# Patient Record
Sex: Female | Born: 1980 | Race: White | Hispanic: No | Marital: Married | Smoking: Former smoker
Health system: Southern US, Community
[De-identification: ages and names within clinical notes are randomized; demographics above are authoritative.]

## PROBLEM LIST (undated history)

## (undated) ENCOUNTER — Inpatient Hospital Stay: Payer: Self-pay

## (undated) DIAGNOSIS — F329 Major depressive disorder, single episode, unspecified: Secondary | ICD-10-CM

## (undated) DIAGNOSIS — Z87442 Personal history of urinary calculi: Secondary | ICD-10-CM

## (undated) DIAGNOSIS — F32A Depression, unspecified: Secondary | ICD-10-CM

## (undated) HISTORY — PX: SPINE SURGERY: SHX786

## (undated) HISTORY — PX: WISDOM TOOTH EXTRACTION: SHX21

---

## 2014-05-28 ENCOUNTER — Encounter: Payer: Self-pay | Admitting: Obstetrics and Gynecology

## 2014-10-04 ENCOUNTER — Inpatient Hospital Stay: Payer: Self-pay | Admitting: Obstetrics and Gynecology

## 2015-01-01 NOTE — H&P (Signed)
L&D Evaluation:  History Expanded:  HPI 34 yo G1 with EDD of 09/30/14 per LMP & 7 wk US, presents at 1765w4d with c/o regular, strong contraction since 1900 last night. Denies LOF. Somewhat decreased FM. + Bloody show. [facility], early entry to care, at least 3 fibroids with growth US 77% at 31 wks, bilateral CP cysts for which pt saw DP. Labs: O+/VI/RI/GBS negative.   Presents with contractions   Patient's Medical History No Chronic Illness   Patient's Surgical History none   Medications Pre Natal Vitamins   Allergies NKDA   Social History none   Exam:  Vital Signs initial BP 140/100   General appears uncomfortable   Mental Status clear   Chest no increased work of breathing   Abdomen gravid, tender with contractions   Estimated Fetal Weight 7.5-8 lbs   Pelvic 4/100/-1 (compared to 1 cm at office 10/02/14)   Mebranes Intact   FHT baseline 130, min variability, early decel seen   Ucx regular, q 1-5 min   Impression:  Impression active labor   Plan:  Plan monitor contractions and for cervical change   Comments Saline lock and labs for now Will give IV fluids if NST does not become reactive   Electronic Signatures: Vella KohlerBrothers, Nikiya Starn K (CNM)  (Signed 11-Feb-16 03:51)  Authored: L&D Evaluation   Last Updated: 11-Feb-16 03:51 by Vella KohlerBrothers, Koren Sermersheim K (CNM)

## 2016-08-24 NOTE — L&D Delivery Note (Signed)
Date of delivery: 03/23/2017 Estimated Date of Delivery: 04/11/17 Patient's last menstrual period was 07/05/2016. EGA: 664w2d  Delivery Note At 3:26 PM a viable female was delivered via Vaginal, Spontaneous Delivery (Presentation: OA;  LOA).  APGAR: 6, 8; weight 6 lb 7.4 oz (2930 g).   Placenta status: spontaneous, intact.  Cord:  with the following complications: none.  Cord pH: NA  Anesthesia:  IV Stadol in early labor, Nitrous Oxide at transition Episiotomy: None Lacerations: None Suture Repair: NA Est. Blood Loss (mL):  200  Informed that patient was 7 cm at 3:18 PM and she was requesting an epidural. Informed at 3:25 PM that I was needed in room and that patient was pushing. I arrived in the delivery room about 1 minute later and the head was out and baby being delivered by RN Vergia Albertsarmen Johnson. There was a nuchal and body cord reduced after delivery. Baby was put skin to skin on mom's chest. The cord was clamped x2 and cut by the dad at 3 minutes. The baby was taken to the warmer for further evaluation during transition. Cord blood obtained. Placenta delivered spontaneously and intact with a 3 vessel cord. Perineum intact and no other lacerations. Hemostasis obtained with fundal massage and IM Pitocin. Baby brought back to mom for skin to skin.   Mom to postpartum.  Baby to Couplet care / Skin to Skin.  Tresea MallJane Adelheid Hoggard, CNM 03/23/2017, 4:01 PM

## 2016-08-31 LAB — OB RESULTS CONSOLE HGB/HCT, BLOOD
HEMATOCRIT: 42 %
HEMOGLOBIN: 14 g/dL

## 2016-08-31 LAB — OB RESULTS CONSOLE RPR: RPR: NONREACTIVE

## 2016-08-31 LAB — OB RESULTS CONSOLE VARICELLA ZOSTER ANTIBODY, IGG: Varicella: IMMUNE

## 2016-08-31 LAB — OB RESULTS CONSOLE ANTIBODY SCREEN: Antibody Screen: NEGATIVE

## 2016-08-31 LAB — OB RESULTS CONSOLE ABO/RH: RH Type: POSITIVE

## 2016-08-31 LAB — OB RESULTS CONSOLE RUBELLA ANTIBODY, IGM: Rubella: IMMUNE

## 2016-08-31 LAB — OB RESULTS CONSOLE HIV ANTIBODY (ROUTINE TESTING): HIV: NONREACTIVE

## 2016-08-31 LAB — OB RESULTS CONSOLE PLATELET COUNT: PLATELETS: 291 10*3/uL

## 2016-08-31 LAB — OB RESULTS CONSOLE HEPATITIS B SURFACE ANTIGEN: Hepatitis B Surface Ag: NEGATIVE

## 2016-10-26 ENCOUNTER — Ambulatory Visit (INDEPENDENT_AMBULATORY_CARE_PROVIDER_SITE_OTHER): Payer: BLUE CROSS/BLUE SHIELD | Admitting: Certified Nurse Midwife

## 2016-10-26 ENCOUNTER — Encounter: Payer: Self-pay | Admitting: Certified Nurse Midwife

## 2016-10-26 VITALS — BP 92/52 | Ht 63.0 in | Wt 153.0 lb

## 2016-10-26 DIAGNOSIS — Z3482 Encounter for supervision of other normal pregnancy, second trimester: Secondary | ICD-10-CM

## 2016-10-26 DIAGNOSIS — Z139 Encounter for screening, unspecified: Secondary | ICD-10-CM

## 2016-10-26 DIAGNOSIS — Z3A16 16 weeks gestation of pregnancy: Secondary | ICD-10-CM

## 2016-10-26 DIAGNOSIS — O26852 Spotting complicating pregnancy, second trimester: Secondary | ICD-10-CM

## 2016-10-26 DIAGNOSIS — Z3490 Encounter for supervision of normal pregnancy, unspecified, unspecified trimester: Secondary | ICD-10-CM | POA: Insufficient documentation

## 2016-10-26 NOTE — Progress Notes (Signed)
Pt c/o spotting last week that happened twice. She tried to call and did not receive a call back.

## 2016-10-26 NOTE — Progress Notes (Signed)
Had two episodes of spotting in the last two weeks. No cramping. NO vulvar itching or irritation. Increased vaginal discharge.  Exam: FH at U-2FB FHTs WNL Ext: no lesions or inflammation Vagina: white discharge Cervix: mild eversion, no bleeding Wet prep negative for hyphae, Trich or clue cells A: Spotting in pregnancy, unknown etiology IUP at 16wk1d P: MSAFP today after discussion Anatomy scan and ROB in 3 weeks.

## 2016-10-26 NOTE — Addendum Note (Signed)
Addended by: Farrel ConnersGUTIERREZ, Nassim Cosma on: 10/26/2016 11:38 AM   Modules accepted: Orders

## 2016-10-28 NOTE — Addendum Note (Signed)
Addended by: Farrel ConnersGUTIERREZ, Davarious Tumbleson on: 10/28/2016 08:48 PM   Modules accepted: Orders

## 2016-11-01 LAB — AFP, SERUM, OPEN SPINA BIFIDA
AFP MOM: 0.72
AFP VALUE AFPOSL: 23.5 ng/mL
Gest. Age on Collection Date: 16.1 weeks
Maternal Age At EDD: 35.4 years
OSBR Risk 1 IN: 10000
Test Results:: NEGATIVE
Weight: 153 [lb_av]

## 2016-11-17 ENCOUNTER — Ambulatory Visit (INDEPENDENT_AMBULATORY_CARE_PROVIDER_SITE_OTHER): Payer: BLUE CROSS/BLUE SHIELD

## 2016-11-17 ENCOUNTER — Ambulatory Visit (INDEPENDENT_AMBULATORY_CARE_PROVIDER_SITE_OTHER): Payer: BLUE CROSS/BLUE SHIELD | Admitting: Obstetrics and Gynecology

## 2016-11-17 VITALS — BP 110/72 | Wt 152.0 lb

## 2016-11-17 DIAGNOSIS — Z3A19 19 weeks gestation of pregnancy: Secondary | ICD-10-CM

## 2016-11-17 DIAGNOSIS — Z369 Encounter for antenatal screening, unspecified: Secondary | ICD-10-CM

## 2016-11-17 DIAGNOSIS — Z3482 Encounter for supervision of other normal pregnancy, second trimester: Secondary | ICD-10-CM

## 2016-11-17 DIAGNOSIS — Z3492 Encounter for supervision of normal pregnancy, unspecified, second trimester: Secondary | ICD-10-CM

## 2016-11-17 DIAGNOSIS — O0993 Supervision of high risk pregnancy, unspecified, third trimester: Secondary | ICD-10-CM | POA: Insufficient documentation

## 2016-11-17 DIAGNOSIS — Z139 Encounter for screening, unspecified: Secondary | ICD-10-CM

## 2016-11-17 NOTE — Progress Notes (Signed)
Anatomy scan today incomplete for 4CH view. Will get completion anatomy next visit, pt does not want to know gender yet. No vb. No lof.

## 2016-12-03 ENCOUNTER — Other Ambulatory Visit: Payer: BLUE CROSS/BLUE SHIELD

## 2016-12-03 ENCOUNTER — Encounter: Payer: BLUE CROSS/BLUE SHIELD | Admitting: Advanced Practice Midwife

## 2016-12-08 ENCOUNTER — Ambulatory Visit (INDEPENDENT_AMBULATORY_CARE_PROVIDER_SITE_OTHER): Payer: BLUE CROSS/BLUE SHIELD | Admitting: Obstetrics and Gynecology

## 2016-12-08 ENCOUNTER — Encounter: Payer: Self-pay | Admitting: Obstetrics and Gynecology

## 2016-12-08 ENCOUNTER — Ambulatory Visit (INDEPENDENT_AMBULATORY_CARE_PROVIDER_SITE_OTHER): Payer: BLUE CROSS/BLUE SHIELD

## 2016-12-08 VITALS — BP 104/62 | Wt 165.0 lb

## 2016-12-08 DIAGNOSIS — Z3482 Encounter for supervision of other normal pregnancy, second trimester: Secondary | ICD-10-CM

## 2016-12-08 DIAGNOSIS — Z369 Encounter for antenatal screening, unspecified: Secondary | ICD-10-CM | POA: Diagnosis not present

## 2016-12-08 DIAGNOSIS — Z3492 Encounter for supervision of normal pregnancy, unspecified, second trimester: Secondary | ICD-10-CM

## 2016-12-08 DIAGNOSIS — Z3A22 22 weeks gestation of pregnancy: Secondary | ICD-10-CM | POA: Diagnosis not present

## 2016-12-08 NOTE — Patient Instructions (Signed)
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
## 2016-12-08 NOTE — Progress Notes (Signed)
Follow up anatomy scan today.  

## 2017-01-05 ENCOUNTER — Encounter: Payer: BLUE CROSS/BLUE SHIELD | Admitting: Obstetrics and Gynecology

## 2017-01-14 ENCOUNTER — Ambulatory Visit (INDEPENDENT_AMBULATORY_CARE_PROVIDER_SITE_OTHER): Payer: BLUE CROSS/BLUE SHIELD | Admitting: Advanced Practice Midwife

## 2017-01-14 VITALS — BP 112/62 | Wt 172.0 lb

## 2017-01-14 DIAGNOSIS — Z131 Encounter for screening for diabetes mellitus: Secondary | ICD-10-CM

## 2017-01-14 DIAGNOSIS — Z130 Encounter for screening for diseases of the blood and blood-forming organs and certain disorders involving the immune mechanism: Secondary | ICD-10-CM

## 2017-01-14 DIAGNOSIS — Z113 Encounter for screening for infections with a predominantly sexual mode of transmission: Secondary | ICD-10-CM

## 2017-01-14 DIAGNOSIS — Z3A27 27 weeks gestation of pregnancy: Secondary | ICD-10-CM
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
## 2017-01-29 ENCOUNTER — Ambulatory Visit (INDEPENDENT_AMBULATORY_CARE_PROVIDER_SITE_OTHER): Payer: BLUE CROSS/BLUE SHIELD | Admitting: Obstetrics and Gynecology

## 2017-01-29 ENCOUNTER — Telehealth: Payer: Self-pay | Admitting: Obstetrics and Gynecology

## 2017-01-29 ENCOUNTER — Other Ambulatory Visit: Payer: BLUE CROSS/BLUE SHIELD

## 2017-01-29 VITALS — BP 118/74 | Wt 172.0 lb

## 2017-01-29 DIAGNOSIS — Z113 Encounter for screening for infections with a predominantly sexual mode of transmission: Secondary | ICD-10-CM

## 2017-01-29 DIAGNOSIS — O99343 Other mental disorders complicating pregnancy, third trimester: Secondary | ICD-10-CM

## 2017-01-29 DIAGNOSIS — Z130 Encounter for screening for diseases of the blood and blood-forming organs and certain disorders involving the immune mechanism: Secondary | ICD-10-CM

## 2017-01-29 DIAGNOSIS — Z3493 Encounter for supervision of normal pregnancy, unspecified, third trimester: Secondary | ICD-10-CM

## 2017-01-29 DIAGNOSIS — Z3A29 29 weeks gestation of pregnancy: Secondary | ICD-10-CM

## 2017-01-29 DIAGNOSIS — F329 Major depressive disorder, single episode, unspecified: Secondary | ICD-10-CM

## 2017-01-29 DIAGNOSIS — Z131 Encounter for screening for diabetes mellitus: Secondary | ICD-10-CM

## 2017-01-29 MED ORDER — SERTRALINE HCL 50 MG PO TABS: 50.0000 mg | ORAL_TABLET | Freq: Every day | ORAL | 0 refills | Status: DC

## 2017-01-29 NOTE — Addendum Note (Signed)
Addended by: Thomasene MohairJACKSON, Jeury Mcnab D on: 01/29/2017 05:35 PM   Modules accepted: Orders

## 2017-01-29 NOTE — Telephone Encounter (Signed)
Dr. Jean RosenthalJackson,  This patient was seen this morning and said that you told her if she wanted you would send in a rx for her.  She has decided that she wants you to send it in today.  Pharm. [facility],  [address] [place].

## 2017-01-29 NOTE — Progress Notes (Addendum)
Pt c/o not being able to sleep, pelvic pain. No vb. No lof.  Anger/depressive sx today. EPDS 16.  She wants to monitor.  Zoloft offered. F/u 1 week. No SI/HI.  No e/o DV today.   ADDENDUM: patient  Called back stating she wants to start zoloft. Rx sent in.

## 2017-01-30 LAB — 28 WEEK RH+PANEL
BASOS ABS: 0 10*3/uL (ref 0.0–0.2)
Basos: 0 %
EOS (ABSOLUTE): 0.1 10*3/uL (ref 0.0–0.4)
EOS: 1 %
Gestational Diabetes Screen: 96 mg/dL (ref 65–139)
HEMATOCRIT: 37.6 % (ref 34.0–46.6)
HIV SCREEN 4TH GENERATION: NONREACTIVE
Hemoglobin: 12.7 g/dL (ref 11.1–15.9)
IMMATURE GRANULOCYTES: 0 %
Immature Grans (Abs): 0 10*3/uL (ref 0.0–0.1)
LYMPHS ABS: 1.1 10*3/uL (ref 0.7–3.1)
Lymphs: 17 %
MCH: 30.4 pg (ref 26.6–33.0)
MCHC: 33.8 g/dL (ref 31.5–35.7)
MCV: 90 fL (ref 79–97)
MONOCYTES: 6 %
Monocytes Absolute: 0.4 10*3/uL (ref 0.1–0.9)
NEUTROS PCT: 76 %
Neutrophils Absolute: 5.2 10*3/uL (ref 1.4–7.0)
PLATELETS: 206 10*3/uL (ref 150–379)
RBC: 4.18 x10E6/uL (ref 3.77–5.28)
RDW: 13.2 % (ref 12.3–15.4)
RPR: NONREACTIVE
WBC: 6.8 10*3/uL (ref 3.4–10.8)

## 2017-01-30 NOTE — Telephone Encounter (Signed)
Rx sent in

## 2017-02-01 NOTE — Telephone Encounter (Signed)
Pt aware . States she has not started taking medication yet, but wanted to have it in case she decided to start taking it. Will talk about it at [facility] if she has any questions/ concerns

## 2017-02-09 ENCOUNTER — Ambulatory Visit (INDEPENDENT_AMBULATORY_CARE_PROVIDER_SITE_OTHER): Payer: BLUE CROSS/BLUE SHIELD | Admitting: Obstetrics and Gynecology

## 2017-02-09 VITALS — BP 114/72 | Wt 175.0 lb

## 2017-02-09 DIAGNOSIS — F329 Major depressive disorder, single episode, unspecified: Secondary | ICD-10-CM

## 2017-02-09 DIAGNOSIS — O99343 Other mental disorders complicating pregnancy, third trimester: Secondary | ICD-10-CM

## 2017-02-09 DIAGNOSIS — Z3A31 31 weeks gestation of pregnancy: Secondary | ICD-10-CM

## 2017-02-09 DIAGNOSIS — O0993 Supervision of high risk pregnancy, unspecified, third trimester: Secondary | ICD-10-CM

## 2017-02-09 NOTE — Progress Notes (Signed)
No vb. No lof.  Has not started taking medication yet. Discussed more today. She states she is likely to start now. Continued to monitor. EPDS is 12 today, down from 16 prior.  No SI/HI.

## 2017-02-23 ENCOUNTER — Encounter: Payer: BLUE CROSS/BLUE SHIELD | Admitting: Advanced Practice Midwife

## 2017-03-08 ENCOUNTER — Ambulatory Visit (INDEPENDENT_AMBULATORY_CARE_PROVIDER_SITE_OTHER): Payer: BLUE CROSS/BLUE SHIELD | Admitting: Obstetrics and Gynecology

## 2017-03-08 VITALS — BP 106/70 | Wt 182.0 lb

## 2017-03-08 DIAGNOSIS — F329 Major depressive disorder, single episode, unspecified: Secondary | ICD-10-CM

## 2017-03-08 DIAGNOSIS — Z3490 Encounter for supervision of normal pregnancy, unspecified, unspecified trimester: Secondary | ICD-10-CM

## 2017-03-08 DIAGNOSIS — O99343 Other mental disorders complicating pregnancy, third trimester: Secondary | ICD-10-CM

## 2017-03-08 DIAGNOSIS — O0993 Supervision of high risk pregnancy, unspecified, third trimester: Secondary | ICD-10-CM

## 2017-03-08 DIAGNOSIS — Z3A35 35 weeks gestation of pregnancy: Secondary | ICD-10-CM

## 2017-03-08 NOTE — Progress Notes (Signed)
Prenatal Visit Note Date: 03/08/2017 Clinic: [facility]  Subjective:  Sandy Figueroa is a 36 y.o. G2P1001 at 212w1d being seen today for ongoing prenatal care.  She is currently monitored for the following issues for this low-risk pregnancy and has Second pregnancy; Supervision of high risk pregnancy, antepartum, third trimester; and Depression affecting pregnancy in third trimester, antepartum on her problem list.  Patient reports no complaints.   Contractions: Not present. Vag. Bleeding: None.  Movement: Present. Denies leaking of fluid.   The following portions of the patient's history were reviewed and updated as appropriate: allergies, current medications, past family history, past medical history, past social history, past surgical history and problem list. Problem list updated.  Objective:   Vitals:   03/08/17 1613  BP: 106/70  Weight: 182 lb (82.6 kg)    Fetal Status: Fetal Heart Rate (bpm): 140 Fundal Height: 34 cm Movement: Present     General:  Alert, oriented and cooperative. Patient is in no acute distress.  Skin: Skin is warm and dry. No rash noted.   Cardiovascular: Normal heart rate noted  Respiratory: Normal respiratory effort, no problems with respiration noted  Abdomen: Soft, gravid, appropriate for gestational age. Pain/Pressure: Absent     Pelvic:  Cervical exam deferred        Extremities: Normal range of motion.     Mental Status: Normal mood and affect. Normal behavior. Normal judgment and thought content.   Urinalysis:      Assessment and Plan:  Pregnancy: G2P1001 at 562w1d  1. Supervision of high risk pregnancy, antepartum, third trimester 2. Second pregnancy 3. Depression affecting pregnancy in third trimester, antepartum - not taking zoloft.  EPDS today 9.  4. [redacted] weeks gestation of pregnancy Preterm labor symptoms and general obstetric precautions including but not limited to vaginal bleeding, contractions, leaking of fluid and fetal movement  were reviewed in detail with the patient. Please refer to After Visit Summary for other counseling recommendations.  Return in about 1 week (around 03/15/2017) for Routine Prenatal Appointment.  Thomasene MohairStephen Jackson, MD 03/08/2017 4:49 PM

## 2017-03-17 ENCOUNTER — Ambulatory Visit (INDEPENDENT_AMBULATORY_CARE_PROVIDER_SITE_OTHER): Payer: BLUE CROSS/BLUE SHIELD | Admitting: Obstetrics and Gynecology

## 2017-03-17 VITALS — BP 118/70 | Wt 182.0 lb

## 2017-03-17 DIAGNOSIS — Z3A36 36 weeks gestation of pregnancy: Secondary | ICD-10-CM

## 2017-03-17 DIAGNOSIS — O365930 Maternal care for other known or suspected poor fetal growth, third trimester, not applicable or unspecified: Secondary | ICD-10-CM

## 2017-03-17 DIAGNOSIS — O0993 Supervision of high risk pregnancy, unspecified, third trimester: Secondary | ICD-10-CM

## 2017-03-17 DIAGNOSIS — O99343 Other mental disorders complicating pregnancy, third trimester: Secondary | ICD-10-CM

## 2017-03-17 DIAGNOSIS — F329 Major depressive disorder, single episode, unspecified: Secondary | ICD-10-CM

## 2017-03-17 DIAGNOSIS — F32A Depression, unspecified: Secondary | ICD-10-CM

## 2017-03-17 NOTE — Progress Notes (Signed)
Prenatal Visit Note Date: 03/17/2017 Clinic: [facility]  Subjective:  Sandy Figueroa is a 36 y.o. G2P1001 at 5353w3d being seen today for ongoing prenatal care.  She is currently monitored for the following issues for this high-risk pregnancy and has Second pregnancy; Supervision of high risk pregnancy, antepartum, third trimester; and Depression affecting pregnancy in third trimester, antepartum on her problem list.   Patient reports no bleeding and no leaking.   Contractions: Irregular. Vag. Bleeding: None.  Movement: Present. Denies leaking of fluid.   The following portions of the patient's history were reviewed and updated as appropriate: allergies, current medications, past family history, past medical history, past social history, past surgical history and problem list. Problem list updated.  Objective:   Vitals:   03/17/17 1416  BP: 118/70  Weight: 182 lb (82.6 kg)    Total weight gain so far: 32 lb (14.5 kg)   Fetal Status: Fetal Heart Rate (bpm): 140 Fundal Height: 33 cm Movement: Present     General:  Alert, oriented and cooperative. Patient is in no acute distress.  Skin: Skin is warm and dry. No rash noted.   Cardiovascular: Normal heart rate noted  Respiratory: Normal respiratory effort, no problems with respiration noted  Abdomen: Soft, gravid, appropriate for gestational age. Pain/Pressure: Absent     Pelvic:  Cervical exam deferred        Extremities: Normal range of motion.     Mental Status: Normal mood and affect. Normal behavior. Normal judgment and thought content.   Urinalysis: Urine Protein: Negative Urine Glucose: Negative  Assessment and Plan:  Pregnancy: G2P1001 at 553w3d  1. Supervision of high risk pregnancy, antepartum, third trimester - Strep Gp B NAA - GC/Chlamydia Probe Amp - US OB Follow Up; Future (measureing small for dates. Growth/AFI u/s)  2. Depression affecting pregnancy in third trimester, antepartum No issues today  3. [redacted] weeks  gestation of pregnancy - Strep Gp B NAA - GC/Chlamydia Probe Amp  4. Small-for-dates fetus, third trimester - US OB Follow Up; Future (growth u/s with AFI)  Term labor symptoms and general obstetric precautions including but not limited to vaginal bleeding, contractions, leaking of fluid and fetal movement were reviewed in detail with the patient. Please refer to After Visit Summary for other counseling recommendations.   Return in about 1 day (around 03/18/2017) for schedule u/s for growth/afi with Routine Prenatal Appointment after.   Thomasene MohairStephen Jackson, MD 03/17/2017 2:42 PM

## 2017-03-19 LAB — GC/CHLAMYDIA PROBE AMP
CHLAMYDIA, DNA PROBE: NEGATIVE
Neisseria gonorrhoeae by PCR: NEGATIVE

## 2017-03-19 LAB — STREP GP B NAA: STREP GROUP B AG: NEGATIVE

## 2017-03-22 ENCOUNTER — Inpatient Hospital Stay
Admission: EM | Admit: 2017-03-22 | Discharge: 2017-03-25 | DRG: 775 | Disposition: A | Discharge status: Home / Self Care | Payer: BLUE CROSS/BLUE SHIELD | Attending: Advanced Practice Midwife | Admitting: Advanced Practice Midwife

## 2017-03-22 ENCOUNTER — Ambulatory Visit (INDEPENDENT_AMBULATORY_CARE_PROVIDER_SITE_OTHER): Payer: BLUE CROSS/BLUE SHIELD

## 2017-03-22 ENCOUNTER — Observation Stay
Admission: EM | Admit: 2017-03-22 | Discharge: 2017-03-22 | Disposition: A | Discharge status: Home / Self Care | Payer: BLUE CROSS/BLUE SHIELD | Source: Home / Self Care | Admitting: Obstetrics and Gynecology

## 2017-03-22 ENCOUNTER — Ambulatory Visit (INDEPENDENT_AMBULATORY_CARE_PROVIDER_SITE_OTHER): Payer: BLUE CROSS/BLUE SHIELD | Admitting: Advanced Practice Midwife

## 2017-03-22 VITALS — BP 118/74 | Wt 184.0 lb

## 2017-03-22 DIAGNOSIS — R030 Elevated blood-pressure reading, without diagnosis of hypertension: Secondary | ICD-10-CM | POA: Diagnosis present

## 2017-03-22 DIAGNOSIS — Z9889 Other specified postprocedural states: Secondary | ICD-10-CM

## 2017-03-22 DIAGNOSIS — O0993 Supervision of high risk pregnancy, unspecified, third trimester: Secondary | ICD-10-CM

## 2017-03-22 DIAGNOSIS — F419 Anxiety disorder, unspecified: Secondary | ICD-10-CM

## 2017-03-22 DIAGNOSIS — O99343 Other mental disorders complicating pregnancy, third trimester: Secondary | ICD-10-CM

## 2017-03-22 DIAGNOSIS — Z3A37 37 weeks gestation of pregnancy: Secondary | ICD-10-CM

## 2017-03-22 DIAGNOSIS — O365930 Maternal care for other known or suspected poor fetal growth, third trimester, not applicable or unspecified: Secondary | ICD-10-CM | POA: Diagnosis not present

## 2017-03-22 DIAGNOSIS — O4103X0 Oligohydramnios, third trimester, not applicable or unspecified: Secondary | ICD-10-CM | POA: Diagnosis not present

## 2017-03-22 DIAGNOSIS — F329 Major depressive disorder, single episode, unspecified: Secondary | ICD-10-CM

## 2017-03-22 DIAGNOSIS — O4100X0 Oligohydramnios, unspecified trimester, not applicable or unspecified: Secondary | ICD-10-CM | POA: Diagnosis present

## 2017-03-22 DIAGNOSIS — Z87891 Personal history of nicotine dependence: Secondary | ICD-10-CM | POA: Diagnosis not present

## 2017-03-22 DIAGNOSIS — Z79899 Other long term (current) drug therapy: Secondary | ICD-10-CM

## 2017-03-22 DIAGNOSIS — O26893 Other specified pregnancy related conditions, third trimester: Secondary | ICD-10-CM | POA: Diagnosis present

## 2017-03-22 DIAGNOSIS — O99344 Other mental disorders complicating childbirth: Secondary | ICD-10-CM | POA: Diagnosis present

## 2017-03-22 HISTORY — DX: Major depressive disorder, single episode, unspecified: F32.9

## 2017-03-22 HISTORY — DX: Depression, unspecified: F32.A

## 2017-03-22 LAB — PROTEIN / CREATININE RATIO, URINE
Creatinine, Urine: 150 mg/dL
PROTEIN CREATININE RATIO: 0.13 mg/mg{creat} (ref 0.00–0.15)
Total Protein, Urine: 19 mg/dL

## 2017-03-22 LAB — CBC
HCT: 35.4 % (ref 35.0–47.0)
Hemoglobin: 12.3 g/dL (ref 12.0–16.0)
MCH: 30.1 pg (ref 26.0–34.0)
MCHC: 34.7 g/dL (ref 32.0–36.0)
MCV: 86.8 fL (ref 80.0–100.0)
PLATELETS: 193 10*3/uL (ref 150–440)
RBC: 4.08 MIL/uL (ref 3.80–5.20)
RDW: 12.6 % (ref 11.5–14.5)
WBC: 10.4 10*3/uL (ref 3.6–11.0)

## 2017-03-22 LAB — COMPREHENSIVE METABOLIC PANEL
ALK PHOS: 132 U/L — AB (ref 38–126)
ALT: 12 U/L — AB (ref 14–54)
AST: 24 U/L (ref 15–41)
Albumin: 3 g/dL — ABNORMAL LOW (ref 3.5–5.0)
Anion gap: 7 (ref 5–15)
BILIRUBIN TOTAL: 0.4 mg/dL (ref 0.3–1.2)
BUN: 15 mg/dL (ref 6–20)
CALCIUM: 9.2 mg/dL (ref 8.9–10.3)
CHLORIDE: 107 mmol/L (ref 101–111)
CO2: 22 mmol/L (ref 22–32)
CREATININE: 0.79 mg/dL (ref 0.44–1.00)
Glucose, Bld: 116 mg/dL — ABNORMAL HIGH (ref 65–99)
Potassium: 3.4 mmol/L — ABNORMAL LOW (ref 3.5–5.1)
Sodium: 136 mmol/L (ref 135–145)
TOTAL PROTEIN: 6.7 g/dL (ref 6.5–8.1)

## 2017-03-22 LAB — TYPE AND SCREEN
ABO/RH(D): O POS
Antibody Screen: NEGATIVE

## 2017-03-22 MED ORDER — OXYTOCIN 40 UNITS IN LACTATED RINGERS INFUSION - SIMPLE MED
2.5000 [IU]/h | INTRAVENOUS | Status: DC
  Filled 2017-03-22: qty 1000

## 2017-03-22 MED ORDER — SOD CITRATE-CITRIC ACID 500-334 MG/5ML PO SOLN: 30.0000 mL | ORAL | Status: DC | PRN

## 2017-03-22 MED ORDER — ONDANSETRON HCL 4 MG/2ML IJ SOLN: 4.0000 mg | Freq: Four times a day (QID) | INTRAMUSCULAR | Status: DC | PRN

## 2017-03-22 MED ORDER — LACTATED RINGERS IV SOLN
INTRAVENOUS | Status: DC
  Administered 2017-03-23 (×2): via INTRAVENOUS

## 2017-03-22 MED ORDER — SODIUM CHLORIDE 0.9 % IJ SOLN
INTRAMUSCULAR | Status: AC
  Filled 2017-03-22: qty 50

## 2017-03-22 MED ORDER — OXYTOCIN BOLUS FROM INFUSION: 500.0000 mL | Freq: Once | INTRAVENOUS | Status: DC

## 2017-03-22 MED ORDER — LIDOCAINE HCL (PF) 1 % IJ SOLN: 30.0000 mL | INTRAMUSCULAR | Status: DC | PRN

## 2017-03-22 MED ORDER — LACTATED RINGERS IV SOLN: 500.0000 mL | INTRAVENOUS | Status: DC | PRN

## 2017-03-22 MED ORDER — OXYTOCIN 10 UNIT/ML IJ SOLN
10.0000 [IU] | Freq: Once | INTRAMUSCULAR | Status: DC
  Administered 2017-03-23: 10 [IU] via INTRAMUSCULAR

## 2017-03-22 MED ORDER — TERBUTALINE SULFATE 1 MG/ML IJ SOLN: 0.2500 mg | Freq: Once | INTRAMUSCULAR | Status: DC | PRN

## 2017-03-22 MED ORDER — MISOPROSTOL 25 MCG QUARTER TABLET
25.0000 ug | ORAL_TABLET | ORAL | Status: DC | PRN
  Administered 2017-03-23 (×2): 25 ug via VAGINAL
  Filled 2017-03-22 (×2): qty 1

## 2017-03-22 NOTE — Progress Notes (Signed)
AFI today is 3.54. A 2x2 pocket is noted at a different time during the exam. Growth is 18%, 5# 13oz. Discussion with Dr Jean RosenthalJackson regarding POC. Patient to go to L&D for NST and further discussion with Dr Jean RosenthalJackson. Patient denies LOF, VB. Small amount of blood on glove following cervical check. External Os is 1.5 cm and Internal is 1 cm.

## 2017-03-22 NOTE — H&P (Signed)
OB History & Physical   History of Present Illness:  Chief Complaint: presents for induction of labor for oligohydramnios  HPI:  Sandy Figueroa is a 36 y.o. G2P1001 female at 5061w1d dated by LMP consistent with 10 week ultrasound.  Her pregnancy has been complicated by depression for which she has had to take no medication.    She denies contractions.   She denies leakage of fluid.   She denies vaginal bleeding.   She reports fetal movement.   She denies headache, visual changes, and RUQ pain.    Maternal Medical History:   Past Medical History:  Diagnosis Date  . Depression     Past Surgical History:  Procedure Laterality Date  . SPINE SURGERY    . WISDOM TOOTH EXTRACTION     Allergies: No Known Allergies  Prior to Admission medications   Denies    OB History  Gravida Para Term Preterm AB Living  2 1 1     1   SAB TAB Ectopic Multiple Live Births          1    # Outcome Date GA Lbr Len/2nd Weight Sex Delivery Anes PTL Lv  2 Current           1 Term 10/04/14   8 lb 12 oz (3.969 kg) M Vag-Spont  N LIV      Prenatal care site: [facility]  Social History: She  reports that she quit smoking about 10 years ago. Her smoking use included Cigarettes. She has never used smokeless tobacco. She reports that she does not drink alcohol or use drugs.  Family History: family history includes Heart disease in her father; Hyperlipidemia in her father; Hypertension in her father; Kidney disease in her father.   Review of Systems:  Review of Systems  Constitutional: Negative.   HENT: Negative.   Eyes: Negative.   Respiratory: Negative.   Cardiovascular: Negative.   Gastrointestinal: Negative.   Genitourinary: Negative.   Musculoskeletal: Negative.   Skin: Negative.   Neurological: Negative.   Psychiatric/Behavioral: Negative.      Physical Exam:  Vital Signs: BP (!) 134/96 (BP Location: Left Arm)   Pulse 99   Temp 98.5 F (36.9 C) (Oral)   Resp 19   Ht 5\' 3"  (1.6  m)   Wt 184 lb (83.5 kg)   LMP 07/05/2016   BMI 32.59 kg/m  Physical Exam  Constitutional: She is oriented to person, place, and time and well-developed, well-nourished, and in no distress. No distress.  HENT:  Head: Normocephalic and atraumatic.  Eyes: Conjunctivae are normal. No scleral icterus.  Neck: Normal range of motion. Neck supple. No thyromegaly present.  Cardiovascular: Normal rate, regular rhythm and normal heart sounds.  Exam reveals no gallop and no friction rub.   No murmur heard. Pulmonary/Chest: Effort normal and breath sounds normal. No respiratory distress. She has no wheezes. She has no rales. She exhibits no tenderness.  Abdominal: Soft. Bowel sounds are normal. She exhibits no distension. There is no tenderness. There is no rebound and no guarding.  Gravid, NT  Genitourinary:  Genitourinary Comments: NEFG, cvx: 1/50/-3/medium consistency/posterior  Musculoskeletal: Normal range of motion. She exhibits no edema.  Lymphadenopathy:    She has no cervical adenopathy.  Neurological: She is alert and oriented to person, place, and time. No cranial nerve deficit.  Skin: Skin is warm and dry. No erythema.  Psychiatric: Mood, affect and judgment normal.   Foley bulb placed through cervix without difficulty.  Female chaperone present for pelvic exam and foley bulb placement  Pertinent Results:  Prenatal Labs: Blood type/Rh O positive  Antibody screen negative  Rubella Immune  Varicella Immune    RPR NR  HBsAg negative  HIV negative  GC negative  Chlamydia negative  Genetic screening First trim negative, msAFP neg  1 hour GTT 96  3 hour GTT n/a  GBS negative on 03/17/17   Baseline FHR: 150 beats/min   Variability: moderate   Accelerations: present   Decelerations: absent Contractions: present frequency: rare Overall assessment: category 1   Assessment:  Sandy Figueroa is a 36 y.o. 862P1001 female at 8856w1d with oligohydramnios, elevated blood pressures.    Plan:  1. Admit to Labor & Delivery  2. CBC, T&S, Clrs, IVF 3. GBS negative.   4. Fetwal well-being: reassuring 5. Oligohydramnios: IOL for AFI < 5.0 and no MVP of 2 cm.  Discussed the risks/benefits of continued monitoring of pregnancy versus delivery.  Given the findings today, patient agrees to move forward with IOL.   6. Elevated blood pressures.  Will continue to monitor.  Labs sent as she has had several elevated ones now that might be consistent with gestational hypertension. Will send labs for preeclampsia, including urine protein/creatinine ratio.   Thomasene MohairStephen Tammy Wickliffe, MD 03/22/2017 10:27 PM

## 2017-03-22 NOTE — Discharge Summary (Signed)
See Final progress note 

## 2017-03-22 NOTE — Final Progress Note (Signed)
Physician Final Progress Note  Patient ID: Sandy Figueroa MRN: [id number] DOB/AGE: 35/04/1981 34 y.o.  Admit date: 03/22/2017 Admitting provider: Conard NovakStephen D Anay Walter, MD Discharge date: 03/22/2017  Admission Diagnoses:  1) intrauterine pregnancy at 7617w1d 2) oligohydramnios  Discharge Diagnoses:  1) intrauterine pregnancy at 4617w1d 2) oligohydramnios  History of Present Illness: The patient is a 36 y.o. female G2P1001 at 4917w1d who presents for further evaluation of oligohydramnios.  Her pregnancy has been complicated by depression/anxiety.  She presented for a clinic appointment today due to an ultrasound showing an AFI of 3.5 cm with no pocket measuring > 2.0 cm.  Growth was 17.5th %ile.  FL < 2.0%ile.  She notes +FM, no LOF, no VB.  Denies ctx.    Hospital Course: patient monitored for NST, which was reactive. Her initial BP was borderline. Otherwise her BPs have been normal.  It was strongly recommended to her that we induce for oligohydramnios.  She accepts, but would like to go home to get organized for a couple of hours.  She will present to L&D at 8pm for induction of labor.  Precautions given to return earlier than 8pm (2 hours from now).  Past Medical History: depression  Past Surgical History:  Procedure Laterality Date  . SPINE SURGERY    . WISDOM TOOTH EXTRACTION      No current facility-administered medications on file prior to encounter.    Current Outpatient Prescriptions on File Prior to Encounter  Medication Sig Dispense Refill  . sertraline (ZOLOFT) 50 MG tablet Take 1 tablet (50 mg total) by mouth at bedtime. 30 tablet 0   Allergies: No Known Allergies  Social History   Social History  . Marital status: Married    Spouse name: N/A  . Number of children: N/A  . Years of education: N/A   Occupational History  . Not on file.   Social History Main Topics  . Smoking status: Former Smoker    Types: Cigarettes    Quit date: 08/24/2006  . Smokeless tobacco:  Never Used  . Alcohol use No  . Drug use: No  . Sexual activity: Yes   Other Topics Concern  . Not on file   Social History Narrative  . No narrative on file    Physical Exam: BP 125/84   Pulse 76   Temp 98.1 F (36.7 C) (Oral)   Resp 16   LMP 07/05/2016   Gen: NAD CV: RRR Pulm: CTAB Pelvic: deferred (1cm in clinic today) Ext: no e/c/t  Consults: None  Significant Findings/ Diagnostic Studies: none (see clinic notes from today)  Procedures: NST Baseline FHR: 140 beats/min Variability: moderate Accelerations: present Decelerations: absent Tocometry: irregular, infrequen  Interpretation:  INDICATIONS: oligohydramnios RESULTS:  A NST procedure was performed with FHR monitoring and a normal baseline established, appropriate time of 20-40 minutes of evaluation, and accels >2 seen w 15x15 characteristics.  Results show a REACTIVE NST.     Discharge Condition: stable  Disposition:   Diet: Regular diet  Discharge Activity: Activity as tolerated   Allergies as of 03/22/2017   No Known Allergies     Medication List    TAKE these medications   sertraline 50 MG tablet Commonly known as:  ZOLOFT Take 1 tablet (50 mg total) by mouth at bedtime.        Total time spent taking care of this patient: 30 minutes. Patient to return in 2 hours for IOL.   Signed: Thomasene MohairStephen Moe Graca, MD  03/22/2017, 5:52 PM

## 2017-03-22 NOTE — Progress Notes (Signed)
Growth/afi today. No vb. No lof

## 2017-03-23 DIAGNOSIS — O4103X0 Oligohydramnios, third trimester, not applicable or unspecified: Secondary | ICD-10-CM

## 2017-03-23 DIAGNOSIS — Z3A37 37 weeks gestation of pregnancy: Secondary | ICD-10-CM

## 2017-03-23 MED ORDER — BUTORPHANOL TARTRATE 2 MG/ML IJ SOLN
INTRAMUSCULAR | Status: AC
  Filled 2017-03-23: qty 1

## 2017-03-23 MED ORDER — SENNOSIDES-DOCUSATE SODIUM 8.6-50 MG PO TABS
2.0000 | ORAL_TABLET | ORAL | Status: DC
  Administered 2017-03-24 (×2): 2 via ORAL
  Filled 2017-03-23 (×2): qty 2

## 2017-03-23 MED ORDER — ONDANSETRON HCL 4 MG PO TABS: 4.0000 mg | ORAL_TABLET | ORAL | Status: DC | PRN

## 2017-03-23 MED ORDER — DIBUCAINE 1 % RE OINT: 1.0000 "application " | TOPICAL_OINTMENT | RECTAL | Status: DC | PRN

## 2017-03-23 MED ORDER — SIMETHICONE 80 MG PO CHEW: 80.0000 mg | CHEWABLE_TABLET | ORAL | Status: DC | PRN

## 2017-03-23 MED ORDER — COCONUT OIL OIL
1.0000 "application " | TOPICAL_OIL | Status: DC | PRN
  Administered 2017-03-23: 1 via TOPICAL
  Filled 2017-03-23: qty 120

## 2017-03-23 MED ORDER — SODIUM CHLORIDE FLUSH 0.9 % IV SOLN
INTRAVENOUS | Status: AC
  Filled 2017-03-23: qty 10

## 2017-03-23 MED ORDER — ONDANSETRON HCL 4 MG/2ML IJ SOLN: 4.0000 mg | INTRAMUSCULAR | Status: DC | PRN

## 2017-03-23 MED ORDER — PRENATAL MULTIVITAMIN CH
1.0000 | ORAL_TABLET | Freq: Every day | ORAL | Status: DC
  Administered 2017-03-25: 1 via ORAL
  Filled 2017-03-23 (×2): qty 1

## 2017-03-23 MED ORDER — IBUPROFEN 600 MG PO TABS
600.0000 mg | ORAL_TABLET | Freq: Four times a day (QID) | ORAL | Status: DC
  Administered 2017-03-23 – 2017-03-25 (×9): 600 mg via ORAL
  Filled 2017-03-23 (×9): qty 1

## 2017-03-23 MED ORDER — BENZOCAINE-MENTHOL 20-0.5 % EX AERO
1.0000 "application " | INHALATION_SPRAY | CUTANEOUS | Status: DC | PRN
  Administered 2017-03-23: 1 via TOPICAL
  Filled 2017-03-23: qty 56

## 2017-03-23 MED ORDER — BUTORPHANOL TARTRATE 1 MG/ML IJ SOLN
1.0000 mg | INTRAMUSCULAR | Status: DC | PRN
  Administered 2017-03-23 (×3): 1 mg via INTRAVENOUS
  Filled 2017-03-23: qty 1

## 2017-03-23 MED ORDER — DIPHENHYDRAMINE HCL 25 MG PO CAPS: 25.0000 mg | ORAL_CAPSULE | Freq: Four times a day (QID) | ORAL | Status: DC | PRN

## 2017-03-23 MED ORDER — TETANUS-DIPHTH-ACELL PERTUSSIS 5-2.5-18.5 LF-MCG/0.5 IM SUSP: 0.5000 mL | Freq: Once | INTRAMUSCULAR | Status: DC

## 2017-03-23 MED ORDER — ACETAMINOPHEN 325 MG PO TABS: 650.0000 mg | ORAL_TABLET | ORAL | Status: DC | PRN

## 2017-03-23 MED ORDER — WITCH HAZEL-GLYCERIN EX PADS: 1.0000 "application " | MEDICATED_PAD | CUTANEOUS | Status: DC | PRN

## 2017-03-23 NOTE — Progress Notes (Addendum)
Pt removed tape from foley last night (per Bobbye RiggsAlexis Evans RN and per pt). Educated pt on need for traction with foley bulb to apply pressure on cervix. Patient is contracting Q1-2 and requests to wait until 0900 check to apply tape or find alternative traction.

## 2017-03-23 NOTE — Discharge Summary (Signed)
OB Discharge Summary     Patient Name: Sandy Figueroa DOB: 05/29/1981 MRN: [id number]  Date of admission: 03/22/2017 Delivering provider: Tresea MallJane Gledhill, CNM called to room as baby was being delivered by RN Vergia Albertsarmen Johnson Date of Delivery: 03/23/2017  Date of discharge: 03/25/2017  Admitting diagnosis: Term pregnancy with oligohydraminos Intrauterine pregnancy: 4774w2d     Secondary diagnosis: None     Discharge diagnosis: Term Pregnancy Delivered   oligohydramnios                                                                                             Post partum procedures:none  Augmentation: Cytotec and Foley Balloon  Complications: None  Hospital course:  Induction of Labor With Vaginal Delivery   36 y.o. yo G2P2002 at 174w2d was admitted to the hospital 03/22/2017 for induction of labor.   Indication for induction: oligohydramnios.   Patient had an uncomplicated labor course as follows: Membrane Rupture Time/Date: 2:45 PM ,03/23/2017   Patient had delivery of viable female at 3:26 PM, 03/23/2017 Details of delivery can be found in separate delivery note.   Patient had a routine postpartum course. Patient is discharged home 03/25/2017.  Physical exam  Vitals:   03/24/17 1105 03/24/17 1558 03/24/17 2008 03/25/17 0857  BP: 103/67  124/84 116/81  Pulse: 69  64 74  Resp: 18  20 17   Temp: 98 F (36.7 C) 98.2 F (36.8 C) 97.9 F (36.6 C) 98.2 F (36.8 C)  TempSrc: Oral Oral Oral Oral  SpO2:   99% 99%  Weight:      Height:       General: alert, cooperative and no distress/no pain/ voiding without difficulty/ tolerating regular diet/ ambulating ad lib Lochia: appropriate Uterine Fundus: firm/ U-4/ ML/ NT Incision: N/A DVT Evaluation: No evidence of DVT seen on physical exam.  Labs: Lab Results  Component Value Date   WBC 13.6 (H) 03/24/2017   HGB 12.2 03/24/2017   HCT 34.9 (L) 03/24/2017   MCV 89.0 03/24/2017   PLT 161 03/24/2017    Discharge instruction: per After  Visit Summary.  Medications:  Allergies as of 03/25/2017   No Known Allergies     Medication List    TAKE these medications   ibuprofen 600 MG tablet Commonly known as:  ADVIL,MOTRIN Take 1 tablet (600 mg total) by mouth every 6 (six) hours as needed.   Vitamins/Minerals Tabs Take 1 tablet by mouth daily. Dotera vitamins       Diet: routine diet  Activity: Advance as tolerated. Pelvic rest for 6 weeks.   Outpatient follow up: Follow-up Information    Tresea MallGledhill, Jane, CNM. Schedule an appointment as soon as possible for a visit in 2 week(s).   Specialty:  Obstetrics Why:  depression check Contact information: [address] [place] [id number] [phone]             Postpartum contraception: Condoms Rhogam Given postpartum: NA Rubella vaccine given postpartum: no Varicella vaccine given postpartum: no TDaP given antepartum or postpartum:no last TDAP 2015  Newborn Data: Live born female / Wyatt/ had + Combs (A POS blood type) Birth  Weight: 6 lb 7.4 oz (2930 g) APGAR: 6, 8   Baby Feeding: Breast  Disposition:home with mother  SIGNED:  Farrel ConnersColleen Glenroy Crossen, CNM 03/25/2017 10:35 AM

## 2017-03-24 ENCOUNTER — Encounter: Payer: Self-pay | Admitting: Certified Nurse Midwife

## 2017-03-24 LAB — CBC
HEMATOCRIT: 34.9 % — AB (ref 35.0–47.0)
Hemoglobin: 12.2 g/dL (ref 12.0–16.0)
MCH: 31 pg (ref 26.0–34.0)
MCHC: 34.8 g/dL (ref 32.0–36.0)
MCV: 89 fL (ref 80.0–100.0)
Platelets: 161 10*3/uL (ref 150–440)
RBC: 3.92 MIL/uL (ref 3.80–5.20)
RDW: 12.5 % (ref 11.5–14.5)
WBC: 13.6 10*3/uL — AB (ref 3.6–11.0)

## 2017-03-24 LAB — RPR: RPR: NONREACTIVE

## 2017-03-24 NOTE — Progress Notes (Signed)
Post Partum Day 1 Subjective: up ad lib, voiding, tolerating PO and breastfeeding. Baby Combs+. Bili labs to be drawn  Objective: Blood pressure 126/76, pulse 62, temperature 98 F (36.7 C), temperature source Oral, resp. rate 20, height 5\' 3"  (1.6 m), weight 83.5 kg (184 lb), last menstrual period 07/05/2016, SpO2 99 %, unknown if currently breastfeeding.  Physical Exam:  General: alert, cooperative and no distress Lochia: appropriate Uterine Fundus: firm/ U-2/ ML/ NT  DVT Evaluation: No evidence of DVT seen on physical exam.   Recent Labs  03/22/17 2201 03/24/17 0507  HGB 12.3 12.2  HCT 35.4 34.9*  WBC 10.4 13.6*  PLT 193 161    Assessment/Plan: Stable PPD #1 Plan for discharge tomorrow  Breast feeding O POS/ RI/ VI Condoms Will need depression check in 1-2 weeks TDAP-last TDAP in 2015  Sandy Figueroa, CNM   LOS: 2 days   Sandy Figueroa 03/24/2017, 8:59 AM

## 2017-03-25 MED ORDER — IBUPROFEN 600 MG PO TABS: 600.0000 mg | ORAL_TABLET | Freq: Four times a day (QID) | ORAL | 0 refills | Status: DC | PRN

## 2017-03-25 NOTE — Progress Notes (Signed)
Pt discharged home with infant.  Discharge instructions and follow up appointment given to and reviewed with pt.  Pt verbalized understanding.  Escorted by auxillary. 

## 2017-03-26 ENCOUNTER — Other Ambulatory Visit: Payer: Self-pay

## 2017-03-26 MED ORDER — IBUPROFEN 600 MG PO TABS: 600.0000 mg | ORAL_TABLET | Freq: Four times a day (QID) | ORAL | 0 refills | Status: DC | PRN

## 2017-03-29 ENCOUNTER — Encounter: Payer: BLUE CROSS/BLUE SHIELD | Admitting: Obstetrics and Gynecology

## 2017-04-09 ENCOUNTER — Ambulatory Visit: Payer: BLUE CROSS/BLUE SHIELD | Admitting: Certified Nurse Midwife

## 2017-05-04 ENCOUNTER — Ambulatory Visit (INDEPENDENT_AMBULATORY_CARE_PROVIDER_SITE_OTHER): Payer: BLUE CROSS/BLUE SHIELD | Admitting: Certified Nurse Midwife

## 2017-05-04 ENCOUNTER — Encounter: Payer: Self-pay | Admitting: Certified Nurse Midwife

## 2017-05-04 VITALS — BP 100/60 | HR 66 | Ht 63.0 in | Wt 169.0 lb

## 2017-05-04 DIAGNOSIS — N6459 Other signs and symptoms in breast: Secondary | ICD-10-CM

## 2017-05-04 DIAGNOSIS — Z124 Encounter for screening for malignant neoplasm of cervix: Secondary | ICD-10-CM

## 2017-05-04 NOTE — Progress Notes (Signed)
Postpartum Visit  Chief Complaint:  Chief Complaint  Patient presents with  . Postpartum Care    blister on nipple    History of Present Illness: Sandy Figueroa is a 36 y.o. [id number] presents for postpartum visit.  Date of delivery: 03/23/2017 Type of delivery: Vaginal delivery - Vacuum or forceps assisted  no Episiotomy No.  Laceration: no  Pregnancy or labor problems:  Yes, depression, on no meds. IOL for oligohydraminos Any problems since the delivery:  No, other than just delveloped a blister and bruising on her left nipple. Has applied lavender and coconut oil with some relief.[name]. Baby is having his frenulum"snipped" tomorrow Newborn Details:  SINGLETON :  1. Baby's name: Sandy Figueroa. Birth weight: 6#7oz Maternal Details:  Breast Feeding:  yes Post partum depression/anxiety noted:  no Edinburgh Post-Partum Depression Score:  1  Date of last PAP: 11/22/2013  normal   Review of Systems: Review of Systems  Constitutional: Negative for chills, fever and weight loss.  HENT: Negative for congestion, sinus pain and sore throat.   Eyes: Negative for blurred vision and pain.  Respiratory: Negative for hemoptysis, shortness of breath and wheezing.   Cardiovascular: Negative for chest pain, palpitations and leg swelling.  Gastrointestinal: Negative for abdominal pain, blood in stool, diarrhea, heartburn, nausea and vomiting.  Genitourinary: Negative for dysuria, frequency, hematuria and urgency.  Musculoskeletal: Negative for back pain, joint pain and myalgias.  Skin: Negative for itching and rash.  Neurological: Negative for dizziness, tingling and headaches.  Endo/Heme/Allergies: Negative for environmental allergies and polydipsia. Does not bruise/bleed easily.       Negative for hirsutism   Psychiatric/Behavioral: Negative for depression. The patient is not nervous/anxious and does not have insomnia.   Breasts: positive for left nipple pain and blister  Past Medical History:    Past Medical History:  Diagnosis Date  . Depression     Past Surgical History:  Past Surgical History:  Procedure Laterality Date  . SPINE SURGERY    . WISDOM TOOTH EXTRACTION      Family History:  Family History  Problem Relation Age of Onset  . Hyperlipidemia Father   . Hypertension Father   . Kidney disease Father   . Heart disease Father   . Breast cancer Neg Hx   . Ovarian cancer Neg Hx     Social History:  Social History   Social History  . Marital status: Married    Spouse name: N/A  . Number of children: 2  . Years of education: N/A   Occupational History  . Not on file.   Social History Main Topics  . Smoking status: Former Smoker    Types: Cigarettes    Quit date: 08/24/2006  . Smokeless tobacco: Never Used  . Alcohol use Yes     Comment: occ  . Drug use: No  . Sexual activity: Not Currently    Partners: Male   Other Topics Concern  . Not on file   Social History Narrative  . No narrative on file    Allergies:  No Known Allergies  Medications: Prior to Admission medications   Medication Sig Start Date End Date Taking? Authorizing Provider  ibuprofen (ADVIL,MOTRIN) 600 MG tablet Take 1 tablet (600 mg total) by mouth every 6 (six) hours as needed. 03/26/17   Farrel ConnersGutierrez, Adaria Hole, CNM  Vitamins/Minerals TABS Take 1 tablet by mouth daily. [facility] vitamins    [provider]    Physical Exam Vitals: BP 100/60   Pulse  66   Ht  (1.6 m)   Wt 169 lb (76.7 kg)   LMP  (LMP Unknown)   BMI 29.94 kg/m  General: WF in NAD HEENT: normocephalic, anicteric Neck: No thyroid enlargement, no palpable nodules, no cervical lymphadenpathy Breast: Lactating, no masses palpated, right nipple intact. Left nipple at 6 o'clock with blistered, inflamed area. Pulmonary: No increased work of breathing, CTAB Abdomen: Soft, non-tender, non-distended.  Umbilicus without lesions.  No hepatomegaly or masses palpable. No evidence of  hernia. Genitourinary:  External: Well healed perineum, no lesions or inflammation    Vagina: Normal vaginal mucosa, patulous vaginal opening   Cervix: closed, NT, no bleeding  Uterus: RV,Well involuted, mobile, non-tender  Adnexa: No adnexal masses, non-tender  Rectal: deferred Extremities: no edema, erythema, or tenderness Neurologic: Grossly intact Psychiatric: mood appropriate, affect full  Assessment: 36 y.o. [id number] presenting for 6 week postpartum visit Left nipple trauma/blister  Discussed rotating position holds when breast feeding. Rec seeing Advertising copywriter at [facility] tomorrow. RX for All Purpose Nipple Cream sent to [facility]-to use after each feeding  Plan:  1) Contraception Education given regarding options for contraception. . Patient would like to use condoms/LAM method for contraception. Considering another pregnancy in 1 year.  2)  Pap done - ASCCP guidelines and rational discussed.  Patient opts for every 3 years screening interval.  3) Patient underwent screening for postpartum depression with no concerns noted.  4) Discussed return to normal activity, recommend continuing prenatal vitamins.  5) Follow up 1 year for routine annual exam.  Farrel Conners, CNM

## 2017-05-05 ENCOUNTER — Encounter: Payer: Self-pay | Admitting: Certified Nurse Midwife

## 2017-05-07 LAB — IGP, APTIMA HPV
HPV APTIMA: NEGATIVE
PAP Smear Comment: 0

## 2017-08-03 ENCOUNTER — Encounter: Payer: Self-pay | Admitting: Obstetrics and Gynecology

## 2017-08-03 ENCOUNTER — Telehealth: Payer: Self-pay | Admitting: Obstetrics and Gynecology

## 2017-08-03 NOTE — Telephone Encounter (Signed)
Pt is calling to find out about the paraguard. Please advise.

## 2017-08-03 NOTE — Telephone Encounter (Signed)
Contact pt via portal

## 2017-08-03 NOTE — Telephone Encounter (Signed)
Pt reports currently has started her mentrual cycle and believes she wants this device but has some questions.

## 2019-07-04 ENCOUNTER — Encounter: Payer: Self-pay | Admitting: Emergency Medicine

## 2019-07-04 ENCOUNTER — Emergency Department: Payer: BLUE CROSS/BLUE SHIELD

## 2019-07-04 ENCOUNTER — Emergency Department
Admission: EM | Admit: 2019-07-04 | Discharge: 2019-07-04 | Disposition: A | Discharge status: Home / Self Care | Payer: BLUE CROSS/BLUE SHIELD | Attending: Emergency Medicine | Admitting: Emergency Medicine

## 2019-07-04 DIAGNOSIS — M5126 Other intervertebral disc displacement, lumbar region: Secondary | ICD-10-CM | POA: Insufficient documentation

## 2019-07-04 DIAGNOSIS — M5136 Other intervertebral disc degeneration, lumbar region: Secondary | ICD-10-CM

## 2019-07-04 DIAGNOSIS — Z79899 Other long term (current) drug therapy: Secondary | ICD-10-CM | POA: Insufficient documentation

## 2019-07-04 DIAGNOSIS — Z87891 Personal history of nicotine dependence: Secondary | ICD-10-CM | POA: Insufficient documentation

## 2019-07-04 DIAGNOSIS — M545 Low back pain: Secondary | ICD-10-CM | POA: Diagnosis present

## 2019-07-04 DIAGNOSIS — M5441 Lumbago with sciatica, right side: Secondary | ICD-10-CM | POA: Diagnosis not present

## 2019-07-04 LAB — HCG, QUANTITATIVE, PREGNANCY: hCG, Beta Chain, Quant, S: <1 m[IU]/mL (ref <5)

## 2019-07-04 IMAGING — CR DG HIP (WITH OR WITHOUT PELVIS) 2-3V*R*
1 series · 3 of 3 positions shown · non-contrast
Comparison: No pertinent prior studies available for comparison.

CLINICAL DATA: Pain

EXAM:
DG HIP (WITH OR WITHOUT PELVIS) 2-3V RIGHT

[Series 1: dg hip unilat w or w/o pelvis 2-3 views  · non-contrast · 0.14mm/px · 3 of 3 slices shown]
[im 1/3]
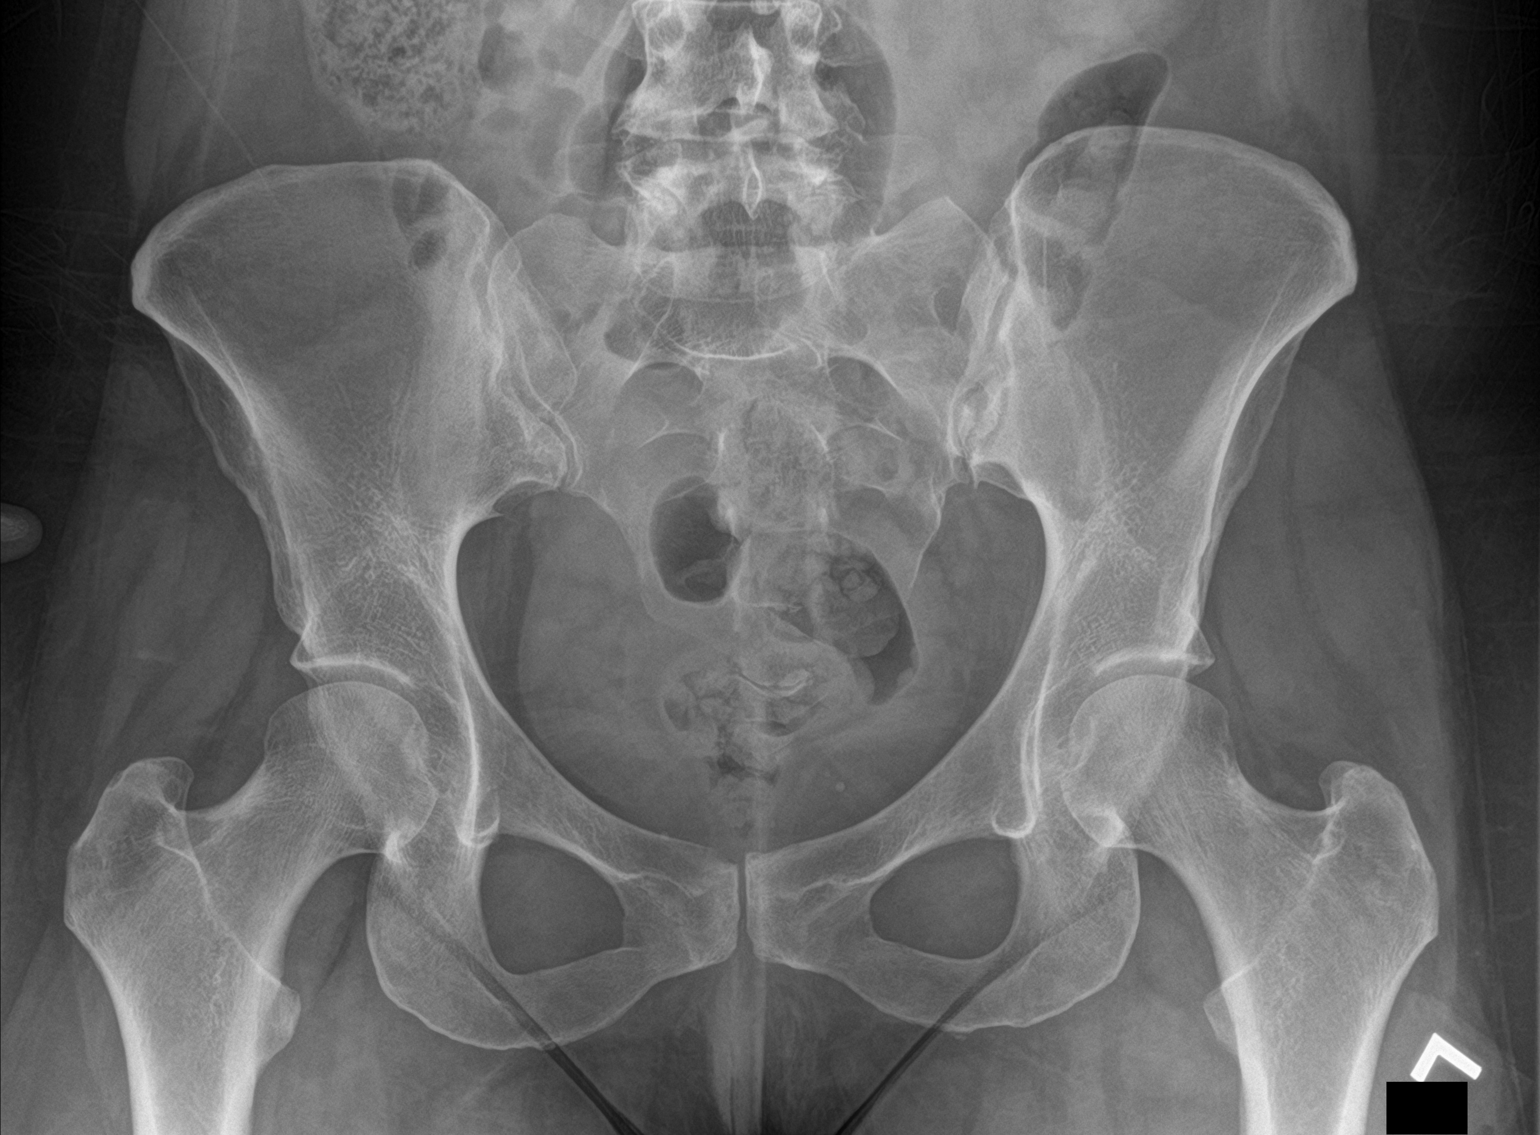
[im 2/3]
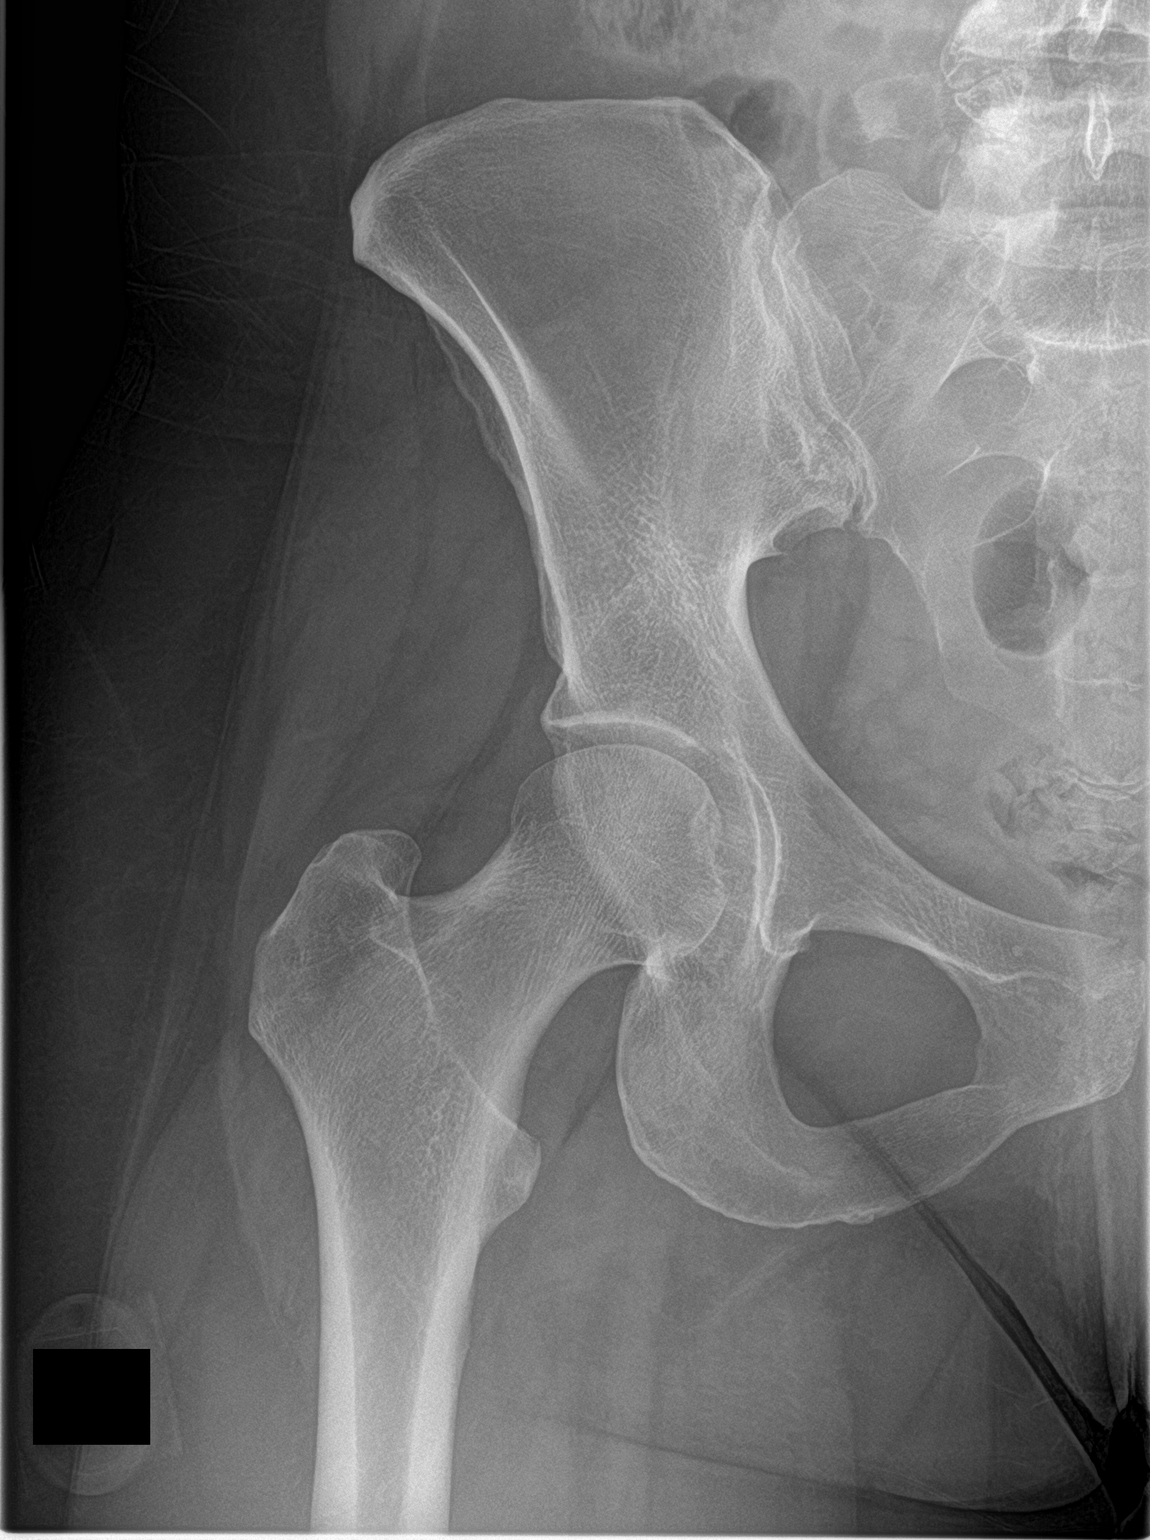
[im 3/3]
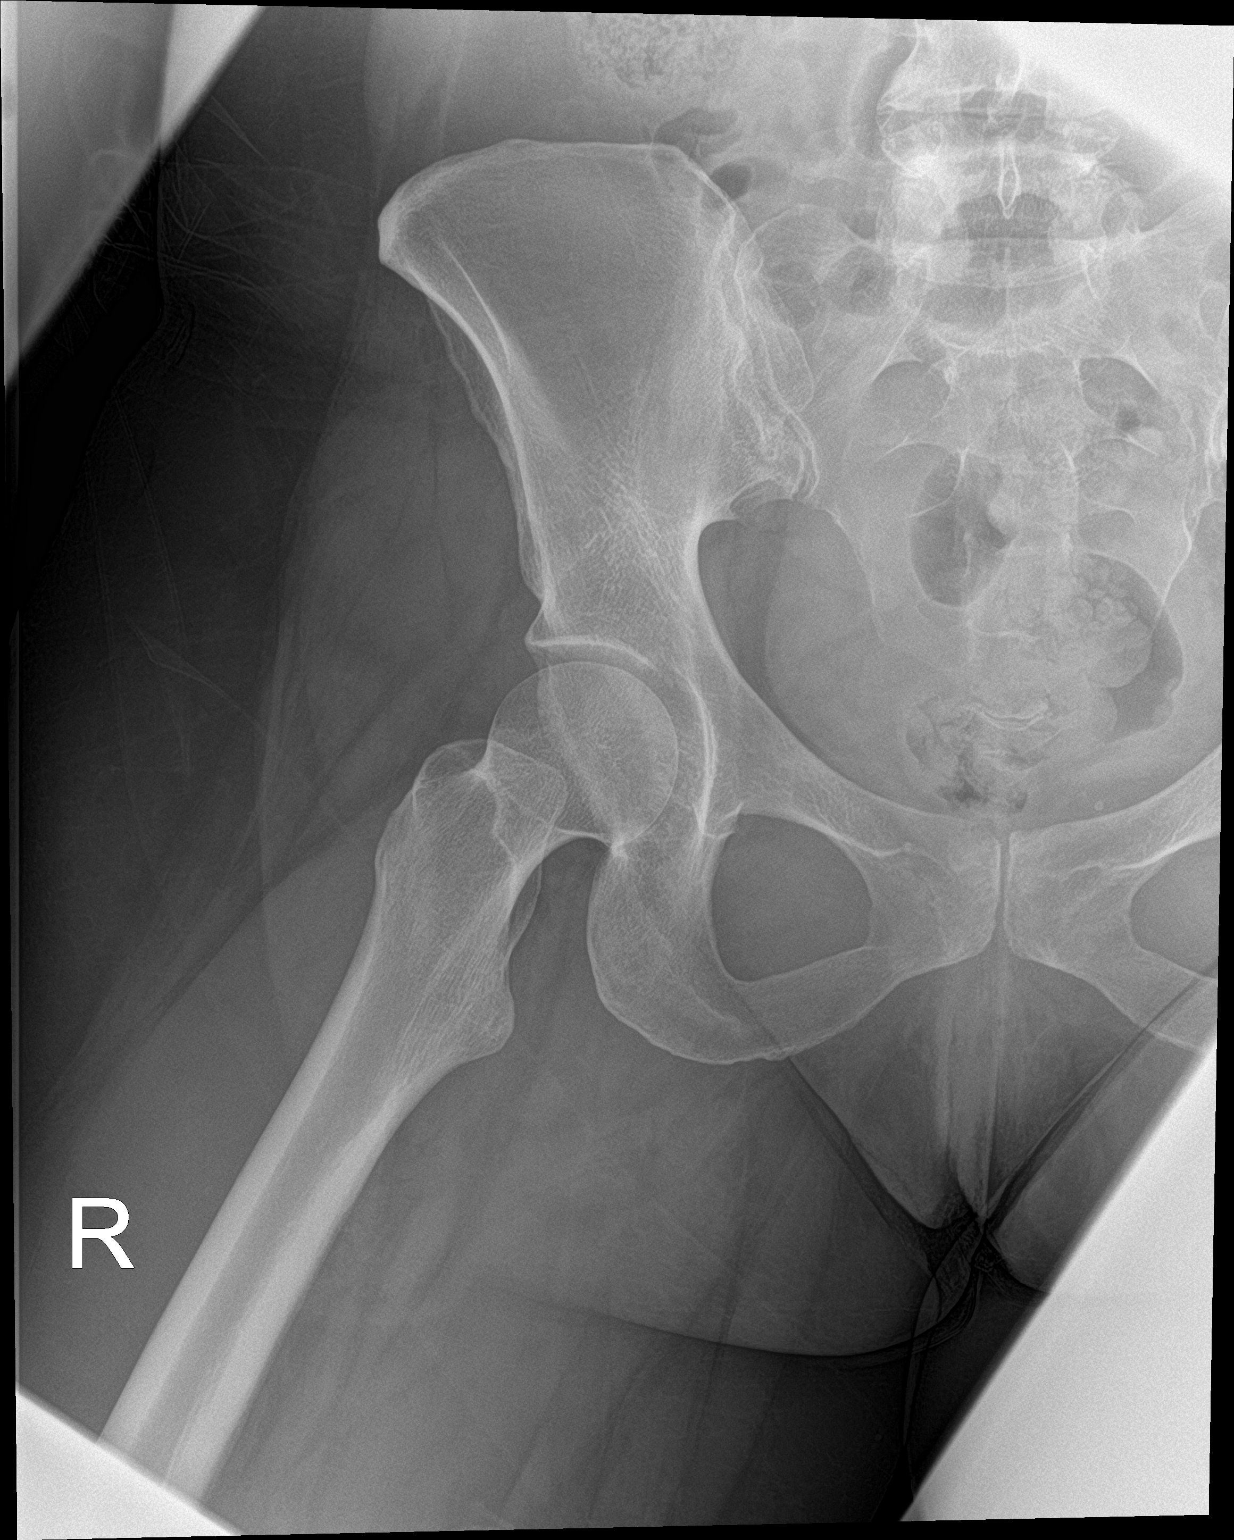

[3 of 3 positions shown; findings below may reference images not displayed]

FINDINGS: There is normal bony alignment.

No evidence of acute osseous or articular abnormality.

Right femoroacetabular joint space maintained.

Incompletely assessed L4-L5 degenerative change
IMPRESSION: No evidence of acute osseous or articular abnormality.

Incompletely assessed L4-L5 degenerative change.

## 2019-07-04 IMAGING — MR MR LUMBAR SPINE W/O CM
5 series · 32 of 48 positions shown · non-contrast
Comparison: None.

CLINICAL DATA: Worsening low back pain, unsteady gait

EXAM:
MRI LUMBAR SPINE WITHOUT CONTRAST
TECHNIQUE: Multiplanar, multisequence MR imaging of the lumbar spine was
performed. No intravenous contrast was administered.

[Series 5: T2 · sagittal · 4.0mm · 0.81mm/px · 7 of 17 slices shown (1 of 2)]
[im 1/17]
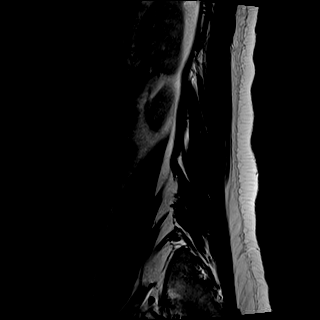
[im 3/17]
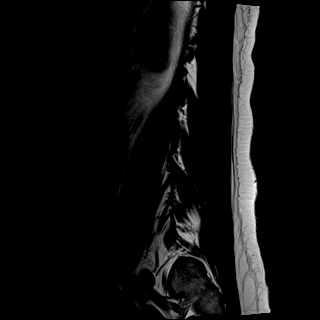
[im 6/17]
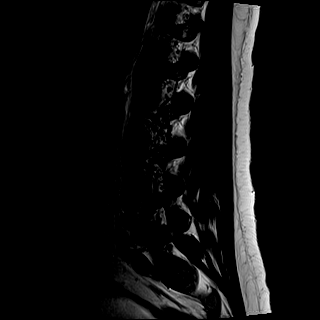
[im 9/17]
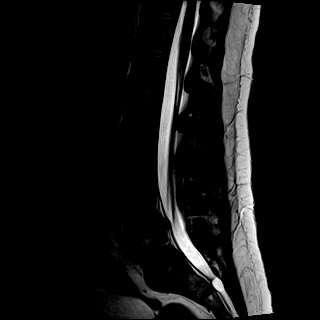
[im 11/17]
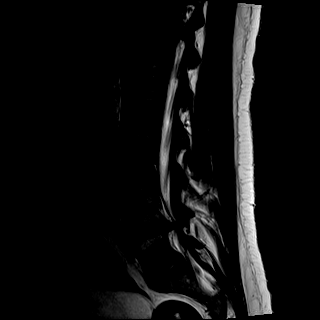
[im 14/17]
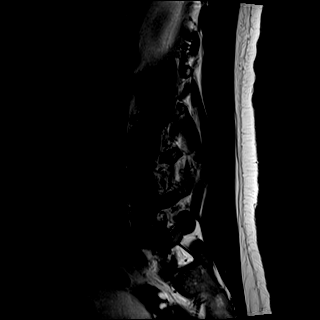
[im 17/17]
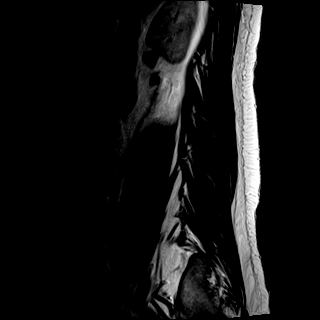

[Series 6: T1 · sagittal · 4.0mm · 0.81mm/px · 7 of 17 slices shown (1 of 2)]
[im 1/17]
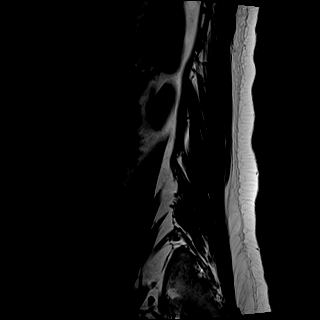
[im 3/17]
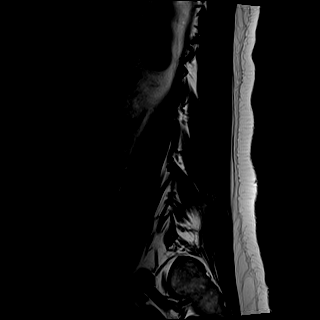
[im 6/17]
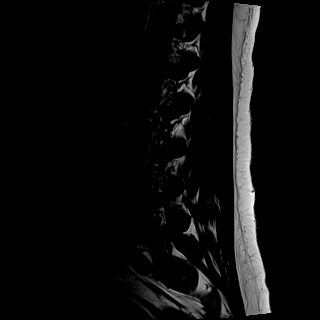
[im 9/17]
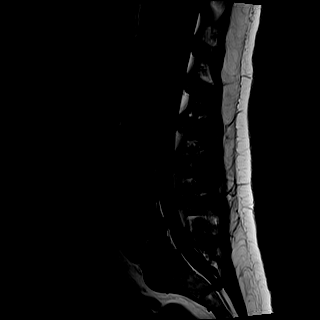
[im 11/17]
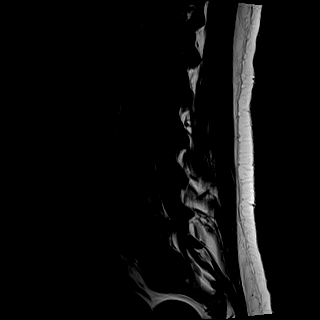
[im 14/17]
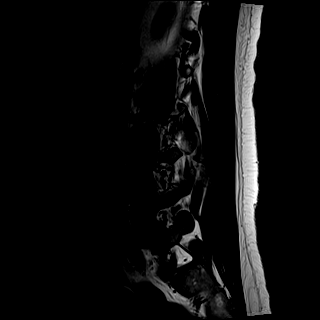
[im 17/17]
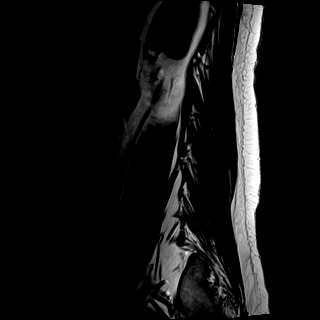

[Series 7: STIR · sagittal · 4.0mm · 0.41mm/px · 2 of 17 slices shown]
[im 1/17]
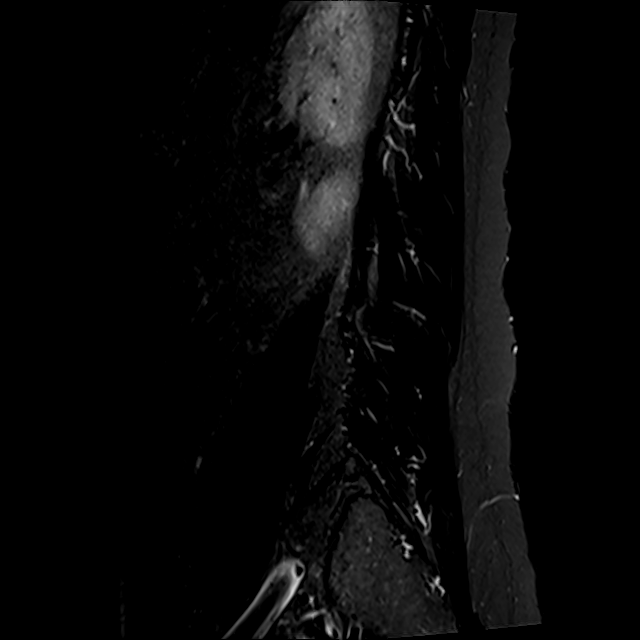
[im 4/17]
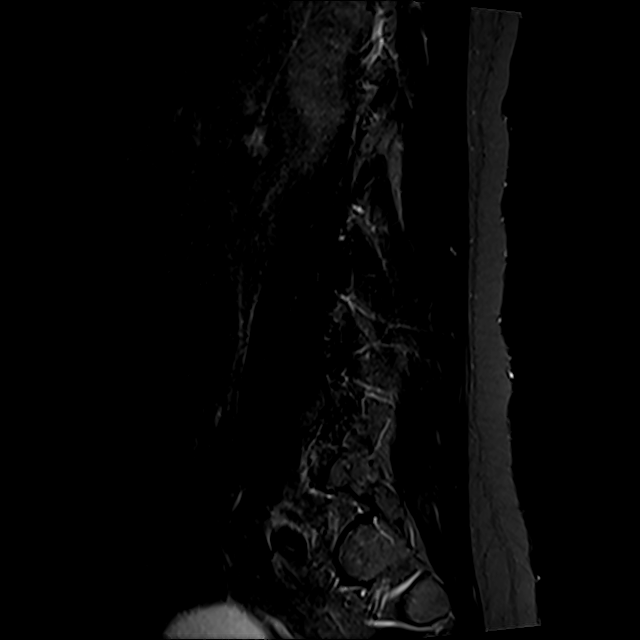

[Series 8: T2 · axial · 4.0mm · 0.78mm/px · z∈[-190,+39]mm · 8 of 38 slices shown (2 of 2)]
[im 1/38]
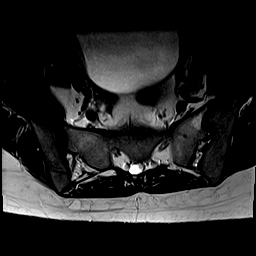
[im 6/38]
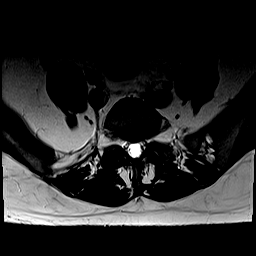
[im 12/38]
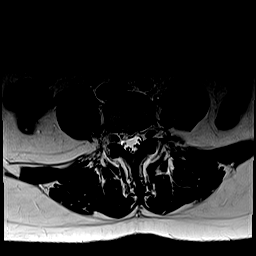
[im 18/38]
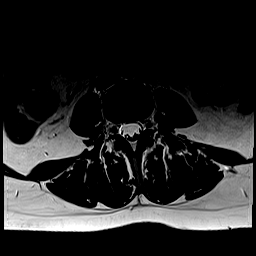
[im 20/38]
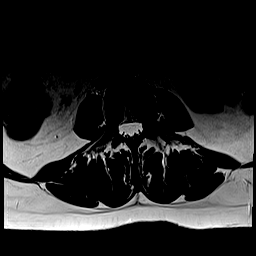
[im 26/38]
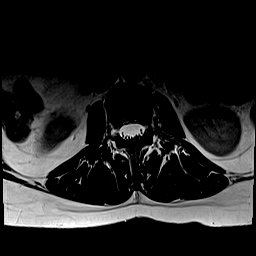
[im 32/38]
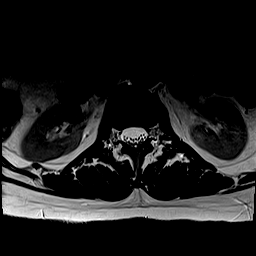
[im 38/38]
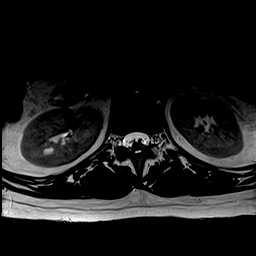

[Series 9: T1 · axial · 4.0mm · 0.39mm/px · z∈[-190,+39]mm · 8 of 38 slices shown (2 of 2)]
[im 1/38]
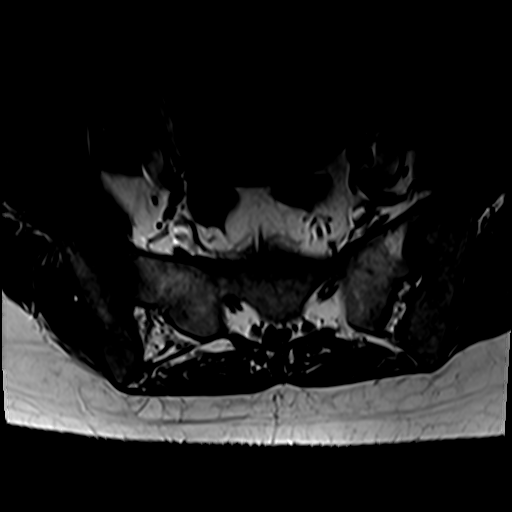
[im 6/38]
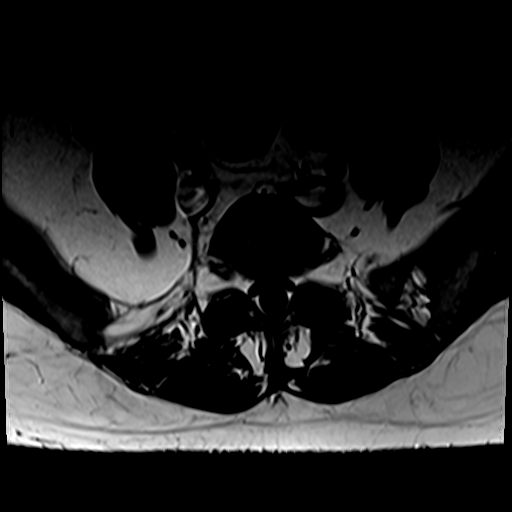
[im 12/38]
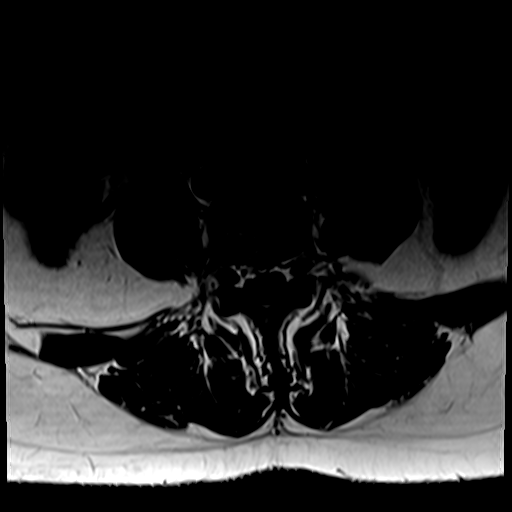
[im 18/38]
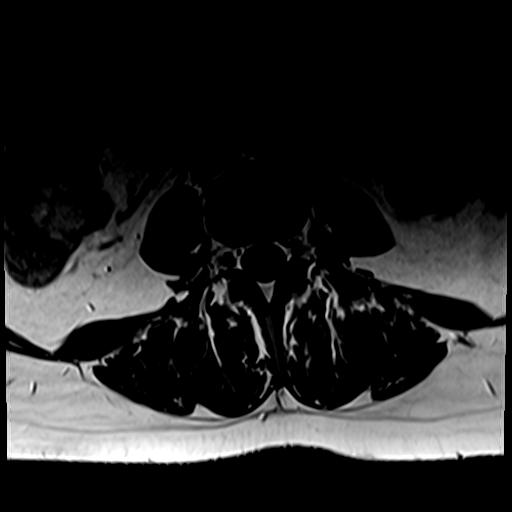
[im 20/38]
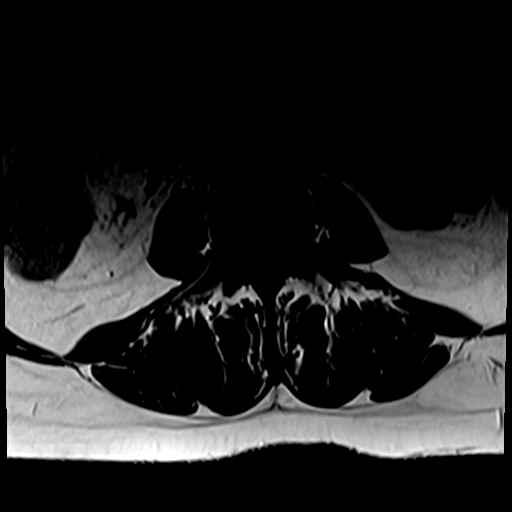
[im 26/38]
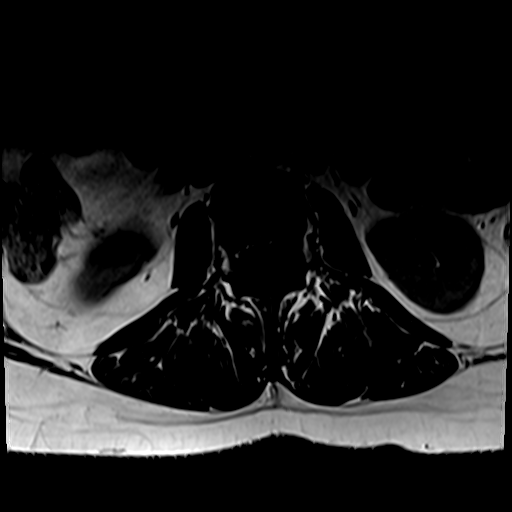
[im 32/38]
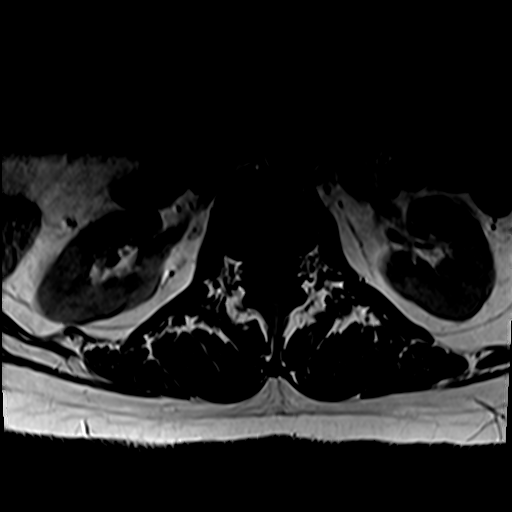
[im 38/38]
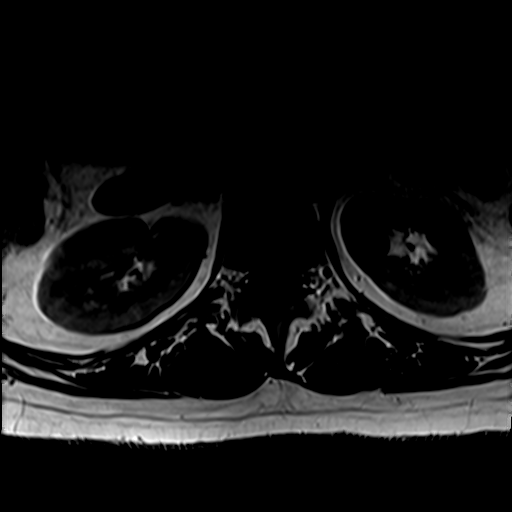

[32 of 48 positions shown; findings below may reference images not displayed]

FINDINGS: Segmentation: Standard. Inferior-most well developed disc space is
designated L5-S1.

Alignment:  Physiologic.

Vertebrae: No fracture, evidence of discitis, or bone lesion.
Chronic discogenic endplate marrow changes at L4-5. Small
intraosseous hemangioma within the L4 vertebral body.

Conus medullaris and cauda equina: Conus extends to the L1 level.
Conus and cauda equina appear normal.

Paraspinal and other soft tissues: Negative.

Disc levels:

T12-L1: Unremarkable.

L1-L2: Unremarkable.

L2-L3: Unremarkable.

L3-L4: Unremarkable.

L4-L5: Focal right subarticular disc extrusion resulting in severe
right subarticular recess stenosis with compression of the
descending right L5 nerve root and mild canal stenosis. Bilateral
neural foramina are patent.

L5-S1: Unremarkable.
IMPRESSION: Focal right subarticular disc extrusion at L4-L5 resulting in severe
right subarticular recess stenosis with compression of the
descending right L5 nerve root and mild canal stenosis.

## 2019-07-04 MED ORDER — LIDOCAINE 5 % EX PTCH
1.0000 | MEDICATED_PATCH | CUTANEOUS | Status: DC
  Administered 2019-07-04: 1 via TRANSDERMAL
  Filled 2019-07-04: qty 1

## 2019-07-04 MED ORDER — ORPHENADRINE CITRATE 30 MG/ML IJ SOLN
60.0000 mg | Freq: Two times a day (BID) | INTRAMUSCULAR | Status: DC
  Administered 2019-07-04: 60 mg via INTRAVENOUS
  Filled 2019-07-04: qty 2

## 2019-07-04 MED ORDER — HYDROMORPHONE HCL 1 MG/ML IJ SOLN
1.0000 mg | Freq: Once | INTRAMUSCULAR | Status: AC
  Administered 2019-07-04: 1 mg via INTRAVENOUS
  Filled 2019-07-04: qty 1

## 2019-07-04 MED ORDER — OXYCODONE-ACETAMINOPHEN 5-325 MG PO TABS: 1.0000 | ORAL_TABLET | ORAL | 0 refills | Status: DC | PRN

## 2019-07-04 MED ORDER — FENTANYL CITRATE (PF) 100 MCG/2ML IJ SOLN
50.0000 ug | Freq: Once | INTRAMUSCULAR | Status: AC
  Administered 2019-07-04: 50 ug via INTRAVENOUS
  Filled 2019-07-04: qty 2

## 2019-07-04 MED ORDER — OXYCODONE-ACETAMINOPHEN 5-325 MG PO TABS
1.0000 | ORAL_TABLET | Freq: Once | ORAL | Status: AC
  Administered 2019-07-04: 1 via ORAL
  Filled 2019-07-04: qty 1

## 2019-07-04 NOTE — ED Provider Notes (Signed)
[facility] Emergency Department Provider Note  ____________________________________________   First MD Initiated Contact with Patient 07/04/19 [phone]     (approximate)  I have reviewed the triage vital signs and the nursing notes.   HISTORY  Chief Complaint Back Pain    HPI Sandy Figueroa is a 38 y.o. female presents emergency department with worsening lower back pain.  Patient has been diagnosed with a tilted hip and bulging disc in the lower spine.  Pain radiating down the leg into the foot has become much worse.  She has had 6 weeks of physical therapy, steroid injections, and feels that she is getting worse his pain medicine is not controlling pain.  She does have an appointment with [facility] tomorrow.  She is out of her pain medication.  She is having difficulty walking.   Denies loss of bowel or bladder control.   Past Medical History:  Diagnosis Date  . Depression     Patient Active Problem List   Diagnosis Date Noted  . Postpartum care following vaginal delivery 03/23/2017  . Depression affecting pregnancy in third trimester, antepartum 01/29/2017  . Supervision of high risk pregnancy, antepartum, third trimester 11/17/2016    Past Surgical History:  Procedure Laterality Date  . SPINE SURGERY    . WISDOM TOOTH EXTRACTION      Prior to Admission medications   Medication Sig Start Date End Date Taking? Authorizing Provider  oxyCODONE-acetaminophen (PERCOCET) 5-325 MG tablet Take 1 tablet by mouth every 4 (four) hours as needed for severe pain. 07/04/19 07/03/20  Faythe Ghee, PA-C  Vitamins/Minerals TABS Take 1 tablet by mouth daily. [facility] vitamins    [provider]    Allergies Patient has no known allergies.  Family History  Problem Relation Age of Onset  . Hyperlipidemia Father   . Hypertension Father   . Kidney disease Father   . Heart disease Father   . Breast cancer Neg Hx   . Ovarian cancer Neg Hx      Social History Social History   Tobacco Use  . Smoking status: Former Smoker    Types: Cigarettes    Quit date: 08/24/2006    Years since quitting: 12.8  . Smokeless tobacco: Never Used  Substance Use Topics  . Alcohol use: Yes    Comment: occ  . Drug use: No    Review of Systems  Constitutional: No fever/chills Eyes: No visual changes. ENT: No sore throat. Respiratory: Denies cough Genitourinary: Negative for dysuria. Musculoskeletal: Positive for back pain. Skin: Negative for rash.    ____________________________________________   PHYSICAL EXAM:  VITAL SIGNS: ED Triage Vitals  Enc Vitals Group     BP 07/04/19 0706 (!) 156/89     Pulse Rate 07/04/19 0706 (!) 115     Resp 07/04/19 0706 20     Temp 07/04/19 0706 98.9 F (37.2 C)     Temp Source 07/04/19 0706 Oral     SpO2 07/04/19 0706 100 %     Weight --      Height --      Head Circumference --      Peak Flow --      Pain Score 07/04/19 0740 4     Pain Loc --      Pain Edu? --      Excl. in GC? --     Constitutional: Alert and oriented. Well appearing and in no acute distress. Eyes: Conjunctivae are normal.  Head: Atraumatic. Nose:  No congestion/rhinnorhea. Mouth/Throat: Mucous membranes are moist.   Neck:  supple no lymphadenopathy noted Cardiovascular: Normal rate, regular rhythm.  Respiratory: Normal respiratory effort.  No retractions,  GU: deferred Musculoskeletal: FROM all extremities, warm and well perfused, patient moves very slowly, she is very tearful with any movement, 5/5 strength in the lower extremities.  Neurovascular still intact. Neurologic:  Normal speech and language.  Skin:  Skin is warm, dry and intact. No rash noted. Psychiatric: Mood and affect are normal. Speech and behavior are normal.  ____________________________________________   LABS (all labs ordered are listed, but only abnormal results are displayed)  Labs Reviewed  HCG, QUANTITATIVE, PREGNANCY    ____________________________________________   ____________________________________________  RADIOLOGY  MRI lumbar spine shows disc bulging with compression of the L5 nerve root. X-ray of the right hip is negative  ____________________________________________   PROCEDURES  Procedure(s) performed: Saline lock, Dilaudid 1 mg IV, Norflex IV, fentanyl 50 mg IV, oxycodone p.o.  Procedures    ____________________________________________   INITIAL IMPRESSION / ASSESSMENT AND PLAN / ED COURSE  Pertinent labs & imaging results that were available during my care of the patient were reviewed by me and considered in my medical decision making (see chart for details).   Patient is 38 year old female presents emergency department with worsening back pain.  History of bulging disc.  Physical exam shows patient to be very uncomfortable.  She is able to walk but is walking in a stooped manner.  Neurovascular is still intact.  X-ray of the right hip MRI of the lumbar spine  Patient was given Dilaudid 1 mg IV, Norflex 60 mg IV, fentanyl 50 mg IV    ----------------------------------------- 11:26 AM on 07/04/2019 -----------------------------------------  Patient still appears uncomfortable.  X-ray of the right hip is negative.  MRI of the lumbar spine is showing disc protrusion with compression of the L5 nerve root.  Paged Dr. Adriana Simasook.  Discussed the case with him.  She is to see her in the office today to p.m.  Patient was given a prescription for Percocet 5/325 #15 with no refill.  She was also given a Lidoderm patch to apply to the lower back.  She was discharged stable condition.  She is to return if worsening.  Sandy Figueroa was evaluated in Emergency Department on 07/04/2019 for the symptoms described in the history of present illness. She was evaluated in the context of the global COVID-19 pandemic, which necessitated consideration that the patient might be at risk for infection with  the SARS-CoV-2 virus that causes COVID-19. Institutional protocols and algorithms that pertain to the evaluation of patients at risk for COVID-19 are in a state of rapid change based on information released by regulatory bodies including the CDC and federal and state organizations. These policies and algorithms were followed during the patient's care in the ED.   As part of my medical decision making, I reviewed the following data within the electronic MEDICAL RECORD NUMBER Nursing notes reviewed and incorporated, Labs reviewed beta hCG is negative, Old chart reviewed, Radiograph reviewed x-ray of the right hip is negative, MRI of the lumbar spine shows disc protrusion and nerve root compression., Notes from prior ED visits and [place] Controlled Substance Database  ____________________________________________   FINAL CLINICAL IMPRESSION(S) / ED DIAGNOSES  Final diagnoses:  Bulging lumbar disc  Acute midline low back pain with right-sided sciatica      NEW MEDICATIONS STARTED DURING THIS VISIT:  New Prescriptions   OXYCODONE-ACETAMINOPHEN (PERCOCET) 5-325 MG TABLET  Take 1 tablet by mouth every 4 (four) hours as needed for severe pain.     Note:  This document was prepared using Dragon voice recognition software and may include unintentional dictation errors.    Versie Starks, PA-C 07/04/19 1128    Blake Divine, MD 07/05/19 [phone]

## 2019-07-04 NOTE — ED Notes (Signed)
Patient transported to MRI 

## 2019-07-04 NOTE — ED Notes (Signed)
Patient ambulatory to room with limping gait. Patient is using a wheelchair as a walker due to pain. Patient reports right hip pain that began on Thursday. History of same. Denies urinary incontinence. Even and non labored respirations noted.

## 2019-07-04 NOTE — ED Triage Notes (Signed)
Pt limping into WR, unable to sit in wheelchair. 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 (Not from injury/accident) [facility] and received steroid injection in July. Pt to ED this AM due to intolerable pain in lower back. Pt in tears and gait is unsteady.

## 2019-07-04 NOTE — Discharge Instructions (Signed)
Follow-up with Dr. Lacinda Axon at 2 PM today.  Take medication as prescribed.  Apply ice to the lower back.

## 2019-07-04 NOTE — ED Notes (Signed)
E-signature pad not functional. 

## 2019-11-08 ENCOUNTER — Other Ambulatory Visit: Payer: Self-pay | Admitting: Neurosurgery

## 2019-11-08 DIAGNOSIS — M5441 Lumbago with sciatica, right side: Secondary | ICD-10-CM

## 2019-11-09 ENCOUNTER — Other Ambulatory Visit: Payer: Self-pay

## 2019-11-09 ENCOUNTER — Ambulatory Visit
Admission: RE | Admit: 2019-11-09 | Discharge: 2019-11-09 | Disposition: A | Discharge status: Home / Self Care | Payer: 59 | Source: Ambulatory Visit | Attending: Neurosurgery | Admitting: Neurosurgery

## 2019-11-09 DIAGNOSIS — M5441 Lumbago with sciatica, right side: Secondary | ICD-10-CM | POA: Insufficient documentation

## 2019-11-09 IMAGING — MR MR LUMBAR SPINE WO/W CM
6 of 7 series · 31 of 48 positions shown · IV contrast (7.5ml Gadavist)
Comparison: MRI lumbar spine dated [DATE].

CLINICAL DATA: Low back pain radiating into the right leg. Prior
surgery [DATE].

EXAM:
MRI LUMBAR SPINE WITHOUT AND WITH CONTRAST
TECHNIQUE: Multiplanar and multiecho pulse sequences of the lumbar spine were
obtained without and with intravenous contrast.
CONTRAST:  7.5mL GADAVIST GADOBUTROL 1 MMOL/ML IV SOLN

[Series 5: T2 · sagittal · 4.0mm · 0.81mm/px · 4 of 17 slices shown (1 of 2)]
[im 1/17]
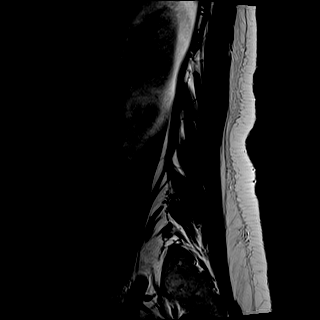
[im 6/17]
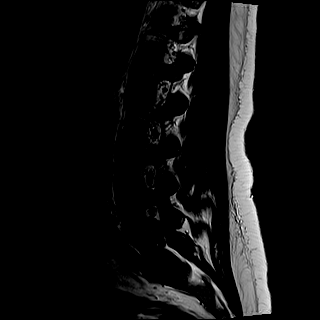
[im 11/17]
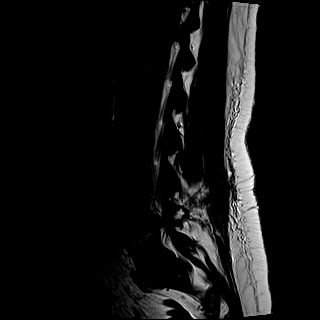
[im 17/17]
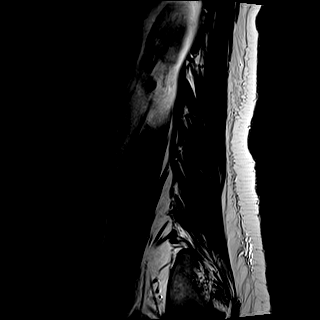

[Series 6: T1 · sagittal · 4.0mm · 0.81mm/px · 4 of 17 slices shown (1 of 2)]
[im 1/17]
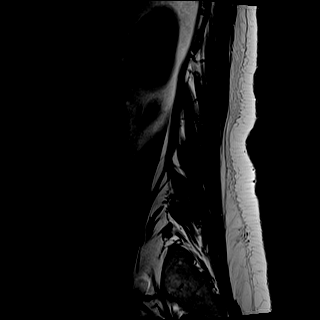
[im 6/17]
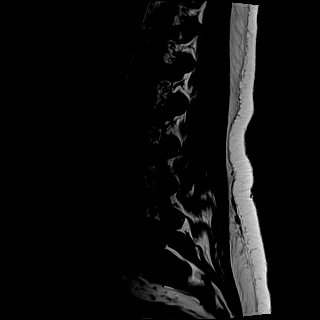
[im 11/17]
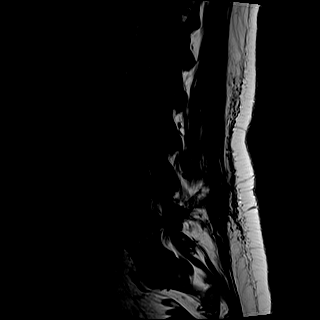
[im 17/17]
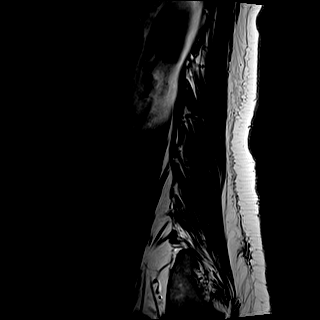

[Series 7: STIR · sagittal · 4.0mm · 0.41mm/px · 2 of 17 slices shown]
[im 1/17]
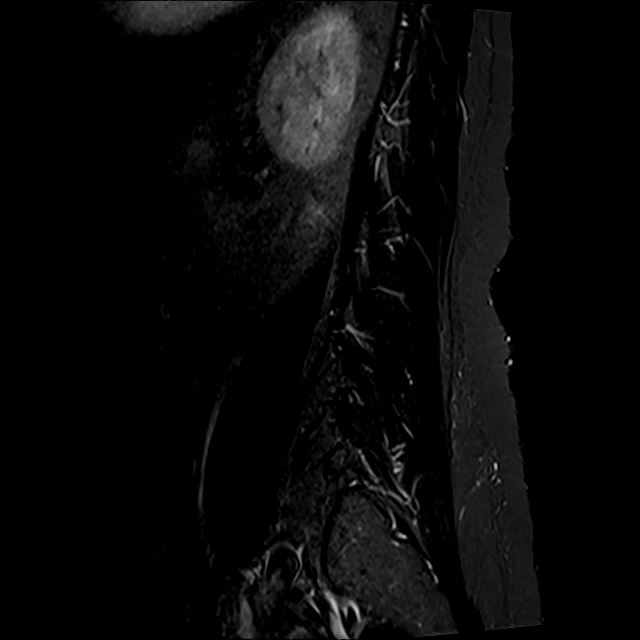
[im 5/17]
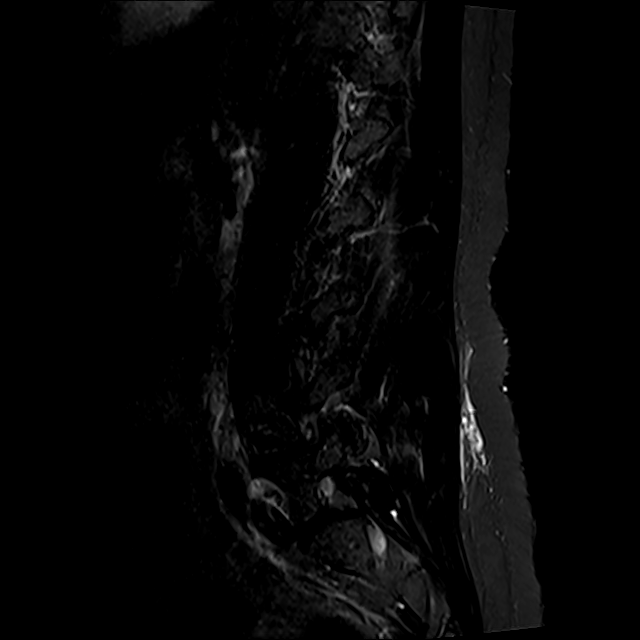

[Series 8: T2 · axial · 4.0mm · 0.78mm/px · z∈[-170,+34]mm · 8 of 35 slices shown (2 of 2)]
[im 1/35]
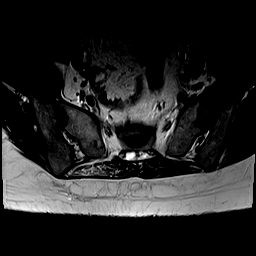
[im 4/35]
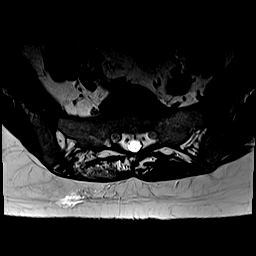
[im 12/35]
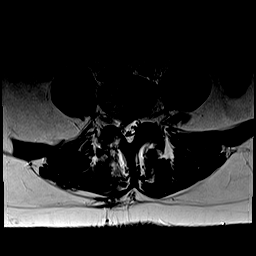
[im 16/35]
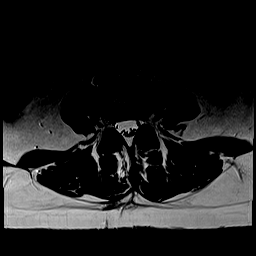
[im 19/35]
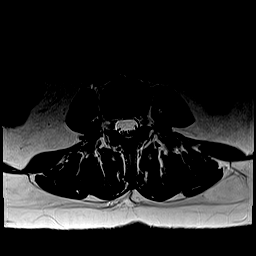
[im 23/35]
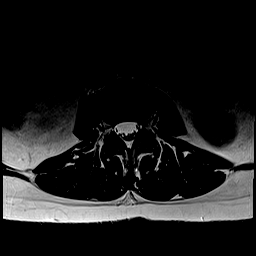
[im 31/35]
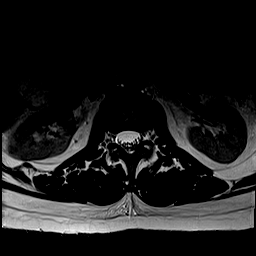
[im 35/35]
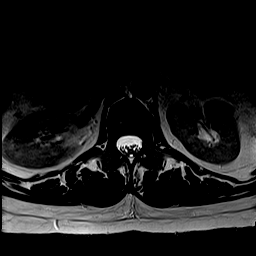

[Series 9: T1 · axial · 4.0mm · 0.39mm/px · z∈[-170,+34]mm · 8 of 35 slices shown (2 of 2)]
[im 1/35]
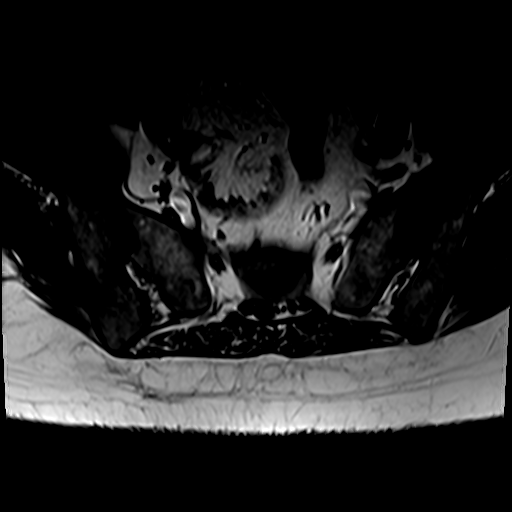
[im 4/35]
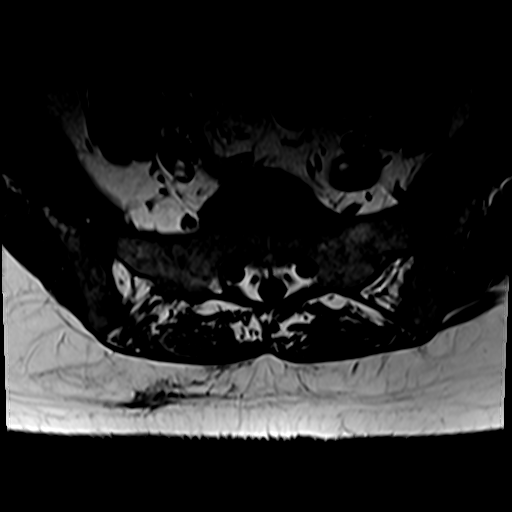
[im 12/35]
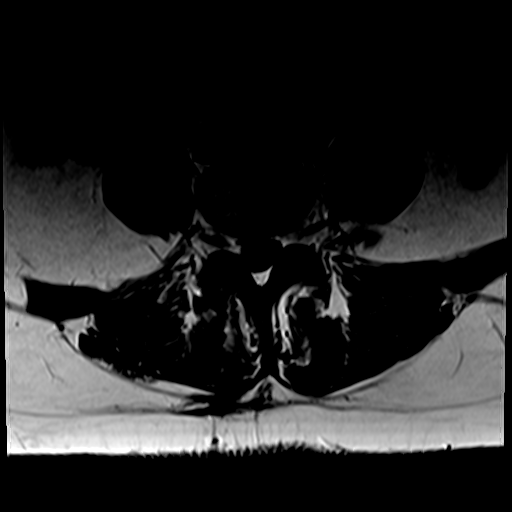
[im 16/35]
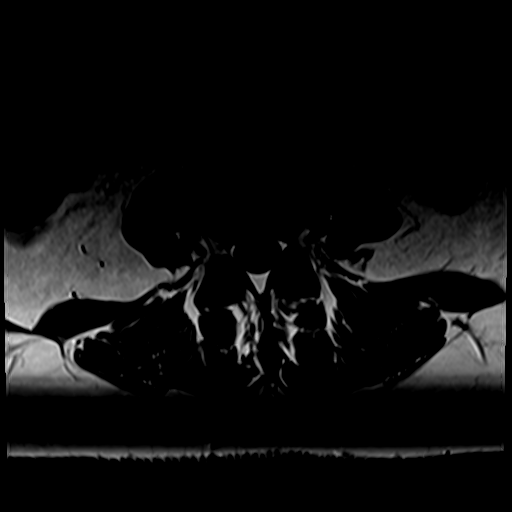
[im 19/35]
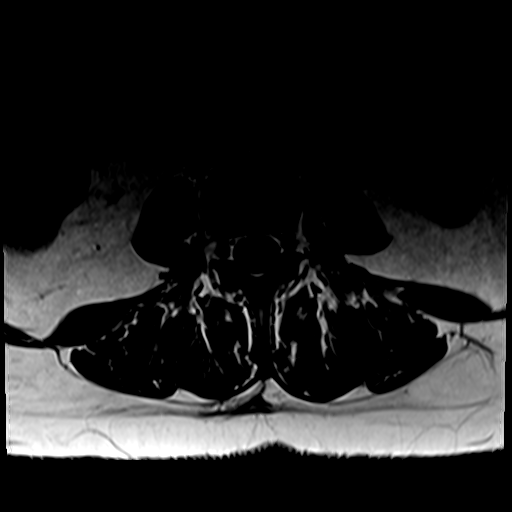
[im 23/35]
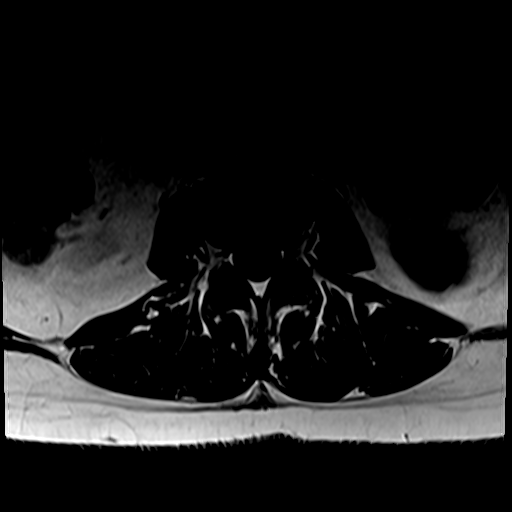
[im 31/35]
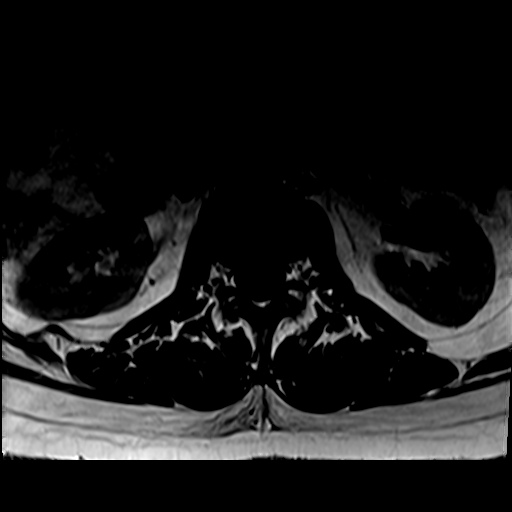
[im 35/35]
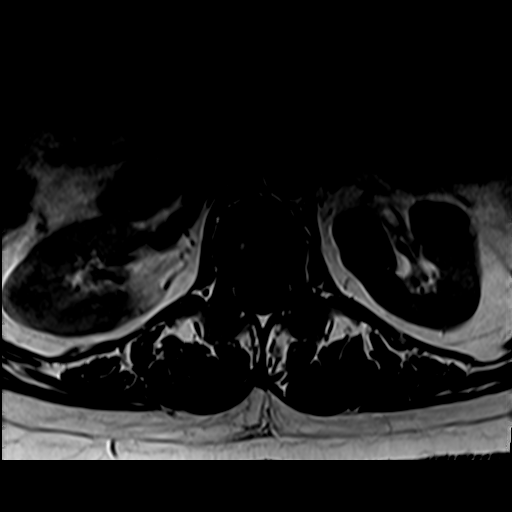

[Series 10: T1 fat-sat post-contrast · sagittal · 4.0mm · 0.81mm/px · 5 of 17 slices shown]
[im 1/17]
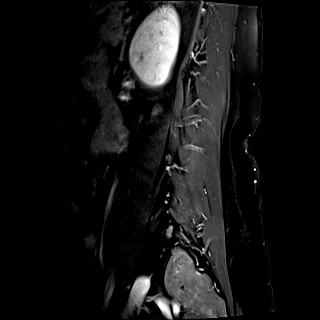
[im 5/17]
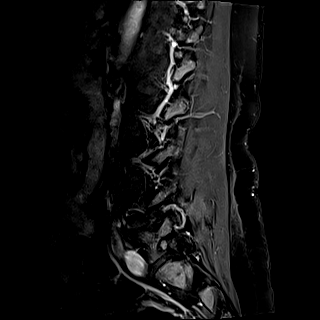
[im 9/17]
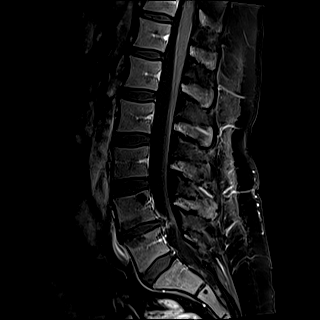
[im 13/17]
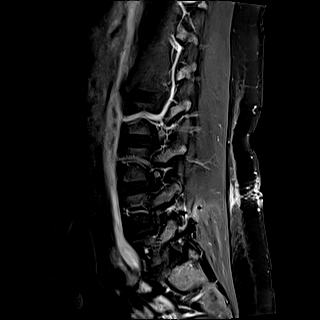
[im 17/17]
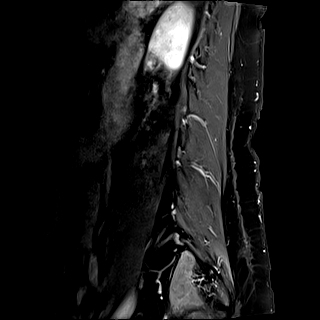

[31 of 48 positions shown; findings below may reference images not displayed]

FINDINGS: Segmentation:  Standard.

Alignment:  Physiologic.

Vertebrae: No fracture, evidence of discitis, or bone lesion.
Chronic degenerative fatty marrow endplate changes at L4-L5, similar
to prior study. L4 hemangioma again noted.

Conus medullaris and cauda equina: Conus extends to the L1 level.
Conus and cauda equina appear normal. No intradural enhancement.

Paraspinal and other soft tissues: Right-sided paraspinous
postsurgical changes at L4-L5. Otherwise negative.

Disc levels:

T12-L1 to L3-L4:  Negative.

L4-L5: Interval right hemilaminectomy and partial facetectomy.
Recurrent large right subarticular disc extrusion obliterating the
right lateral recess with impingement of the descending right L5
nerve root. The right S1 and S2 nerve roots are posteriorly
displaced within the thecal sac. No neuroforaminal stenosis.

L5-S1:  Negative.
IMPRESSION: Postsurgical changes at L4-L5 with recurrent large right
subarticular disc extrusion and impingement of the descending right
L5 nerve root.

## 2019-11-09 MED ORDER — GADOBUTROL 1 MMOL/ML IV SOLN
7.5000 mL | Freq: Once | INTRAVENOUS | Status: AC | PRN
  Administered 2019-11-09: 15:00:00 7.5 mL via INTRAVENOUS

## 2019-11-13 ENCOUNTER — Other Ambulatory Visit: Payer: Self-pay | Admitting: Neurosurgery

## 2019-11-15 ENCOUNTER — Other Ambulatory Visit: Payer: Self-pay

## 2019-11-15 ENCOUNTER — Encounter
Admission: RE | Admit: 2019-11-15 | Discharge: 2019-11-15 | Disposition: A | Discharge status: Home / Self Care | Payer: 59 | Source: Ambulatory Visit | Attending: Neurosurgery | Admitting: Neurosurgery

## 2019-11-15 DIAGNOSIS — Z01812 Encounter for preprocedural laboratory examination: Secondary | ICD-10-CM | POA: Diagnosis not present

## 2019-11-15 HISTORY — DX: Personal history of urinary calculi: Z87.442

## 2019-11-15 NOTE — Patient Instructions (Signed)
Your procedure is scheduled OX:[facility] November 20, 2019 Report to Day Surgery. To find out your arrival time please call [phone] between 1PM - 3PM on Friday November 17, 2019.  Remember: Instructions that are not followed completely may result in serious medical risk,  up to and including death, or upon the discretion of your surgeon and anesthesiologist your  surgery may need to be rescheduled.     _X__ 1. Do not eat food after midnight the night before your procedure.                 No gum chewing or hard candies. You may drink clear liquids up to 2 hours                 before you are scheduled to arrive for your surgery- DO not drink clear                 liquids within 2 hours of the start of your surgery.                 Clear Liquids include:  water, apple juice without pulp, clear Gatorade, G2 or                  Gatorade Zero (avoid Red/Purple/Blue), Black Coffee or Tea (Do not add                 anything to coffee or tea).  __X__2.  On the morning of surgery brush your teeth with toothpaste and water, you                may rinse your mouth with mouthwash if you wish.  Do not swallow any toothpaste of mouthwash.     _X__ 3.  No Alcohol for 24 hours before or after surgery.   _X__ 4.  Do Not Smoke or use e-cigarettes For 24 Hours Prior to Your Surgery.                 Do not use any chewable tobacco products for at least 6 hours prior to                 surgery.  __x__ 5.  Notify your doctor if there is any change in your medical condition      (cold, fever, infections).     Do not wear jewelry, make-up, hairpins, clips or nail polish. Do not wear lotions, powders, or perfumes. You may wear deodorant. Do not shave 48 hours prior to surgery. Men may shave face and neck. Do not bring valuables to the hospital.    [facility] is not responsible for any belongings or valuables.  Contacts, dentures or bridgework may not be worn into surgery. Leave  your suitcase in the car. After surgery it may be brought to your room. For patients admitted to the hospital, discharge time is determined by your treatment team.   Patients discharged the day of surgery will not be allowed to drive home.   Make arrangements for someone to be with you for the first 24 hours of your Same Day Discharge.  __x__ Take these medicines the morning of surgery with A SIP OF WATER:    1. HYDROcodone-acetaminophen    __x__ Use CHG Soap as directed  __x__ Stop Anti-inflammatories such as ibuprofen, naproxen, Aleve, aspirin and or BC powders.    __x__ Stop supplements until after surgery.    __x__ Do not start any herbal supplements before your  surgery.

## 2019-11-16 ENCOUNTER — Other Ambulatory Visit
Admission: RE | Admit: 2019-11-16 | Discharge: 2019-11-16 | Disposition: A | Discharge status: Home / Self Care | Payer: 59 | Source: Ambulatory Visit | Attending: Neurosurgery | Admitting: Neurosurgery

## 2019-11-16 DIAGNOSIS — Z20822 Contact with and (suspected) exposure to covid-19: Secondary | ICD-10-CM | POA: Insufficient documentation

## 2019-11-16 DIAGNOSIS — Z01812 Encounter for preprocedural laboratory examination: Secondary | ICD-10-CM | POA: Diagnosis not present

## 2019-11-16 LAB — SARS CORONAVIRUS 2 (TAT 6-24 HRS): SARS Coronavirus 2: NEGATIVE

## 2019-11-20 ENCOUNTER — Encounter
Admission: RE | Disposition: A | Discharge status: Home / Self Care | Payer: Self-pay | Source: Home / Self Care | Attending: Neurosurgery

## 2019-11-20 ENCOUNTER — Ambulatory Visit: Payer: 59 | Admitting: Anesthesiology

## 2019-11-20 ENCOUNTER — Encounter: Payer: Self-pay | Admitting: Neurosurgery

## 2019-11-20 ENCOUNTER — Ambulatory Visit
Admission: RE | Admit: 2019-11-20 | Discharge: 2019-11-20 | Disposition: A | Discharge status: Home / Self Care | Payer: 59 | Attending: Neurosurgery | Admitting: Neurosurgery

## 2019-11-20 ENCOUNTER — Ambulatory Visit: Payer: 59

## 2019-11-20 ENCOUNTER — Other Ambulatory Visit: Payer: Self-pay

## 2019-11-20 DIAGNOSIS — F329 Major depressive disorder, single episode, unspecified: Secondary | ICD-10-CM | POA: Diagnosis not present

## 2019-11-20 DIAGNOSIS — Z8249 Family history of ischemic heart disease and other diseases of the circulatory system: Secondary | ICD-10-CM | POA: Diagnosis not present

## 2019-11-20 DIAGNOSIS — Z419 Encounter for procedure for purposes other than remedying health state, unspecified: Secondary | ICD-10-CM

## 2019-11-20 DIAGNOSIS — Z6832 Body mass index (BMI) 32.0-32.9, adult: Secondary | ICD-10-CM | POA: Diagnosis not present

## 2019-11-20 DIAGNOSIS — M48061 Spinal stenosis, lumbar region without neurogenic claudication: Secondary | ICD-10-CM | POA: Diagnosis not present

## 2019-11-20 DIAGNOSIS — E669 Obesity, unspecified: Secondary | ICD-10-CM | POA: Diagnosis not present

## 2019-11-20 DIAGNOSIS — Z7952 Long term (current) use of systemic steroids: Secondary | ICD-10-CM | POA: Insufficient documentation

## 2019-11-20 DIAGNOSIS — M5116 Intervertebral disc disorders with radiculopathy, lumbar region: Secondary | ICD-10-CM | POA: Insufficient documentation

## 2019-11-20 DIAGNOSIS — Z87891 Personal history of nicotine dependence: Secondary | ICD-10-CM | POA: Diagnosis not present

## 2019-11-20 DIAGNOSIS — Z79899 Other long term (current) drug therapy: Secondary | ICD-10-CM | POA: Diagnosis not present

## 2019-11-20 HISTORY — PX: HEMI-MICRODISCECTOMY LUMBAR LAMINECTOMY LEVEL 1: SHX5846

## 2019-11-20 LAB — POCT PREGNANCY, URINE: Preg Test, Ur: NEGATIVE

## 2019-11-20 IMAGING — RF DG LUMBAR SPINE 2-3V
1 series · 1 of 1 positions shown · non-contrast
Comparison: None.

CLINICAL DATA: Redo right L4-L5 hemilaminectomy and discectomy

EXAM:
LUMBAR SPINE - 2-3 VIEW; DG C-ARM 1-60 MIN
FLUOROSCOPY TIME:  8 seconds

[Series 1: dg x-ray · 0.20mm/px · 1 of 1 slices shown]
[im 1/1]
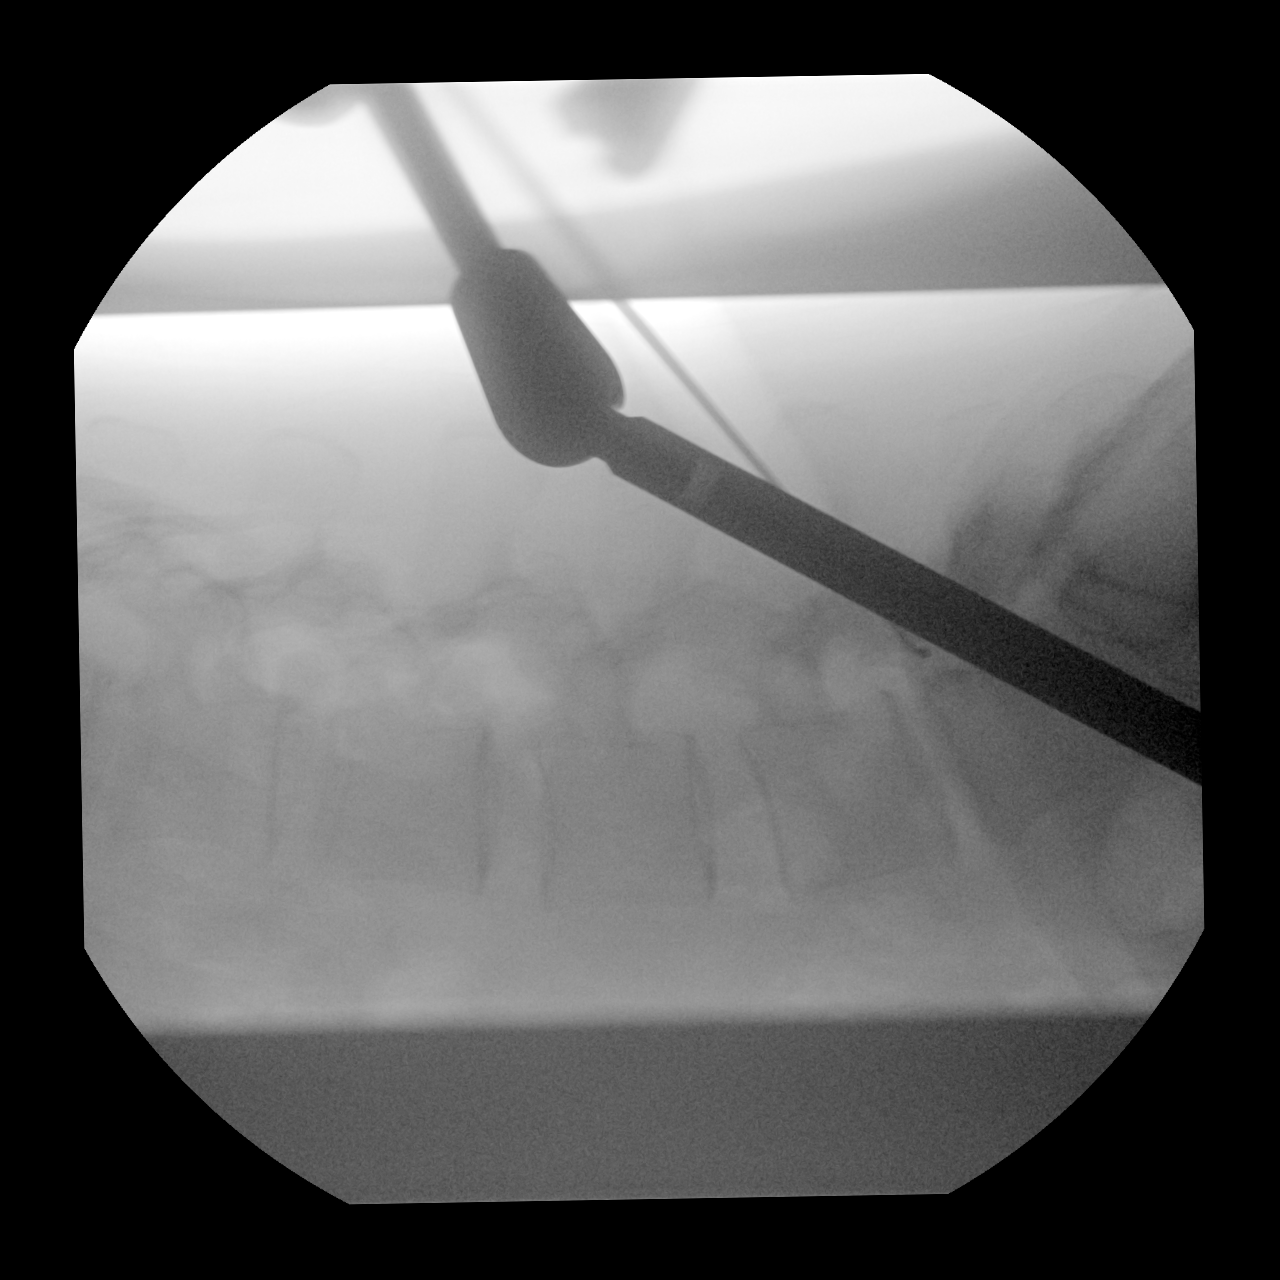

[1 of 1 positions shown; findings below may reference images not displayed]

FINDINGS: Single lateral intraoperative view demonstrates a localizer
overlying the spinal canal at the level of the superior L5 endplate.
IMPRESSION: Fluoroscopic guidance with localizer at the superior L5 level.

## 2019-11-20 IMAGING — RF DG C-ARM 1-60 MIN
1 series · 1 of 1 positions shown · non-contrast
Comparison: None.

CLINICAL DATA: Redo right L4-L5 hemilaminectomy and discectomy

EXAM:
LUMBAR SPINE - 2-3 VIEW; DG C-ARM 1-60 MIN
FLUOROSCOPY TIME:  8 seconds

[Series 1: dg x-ray · 0.20mm/px · 1 of 1 slices shown]
[im 1/1]
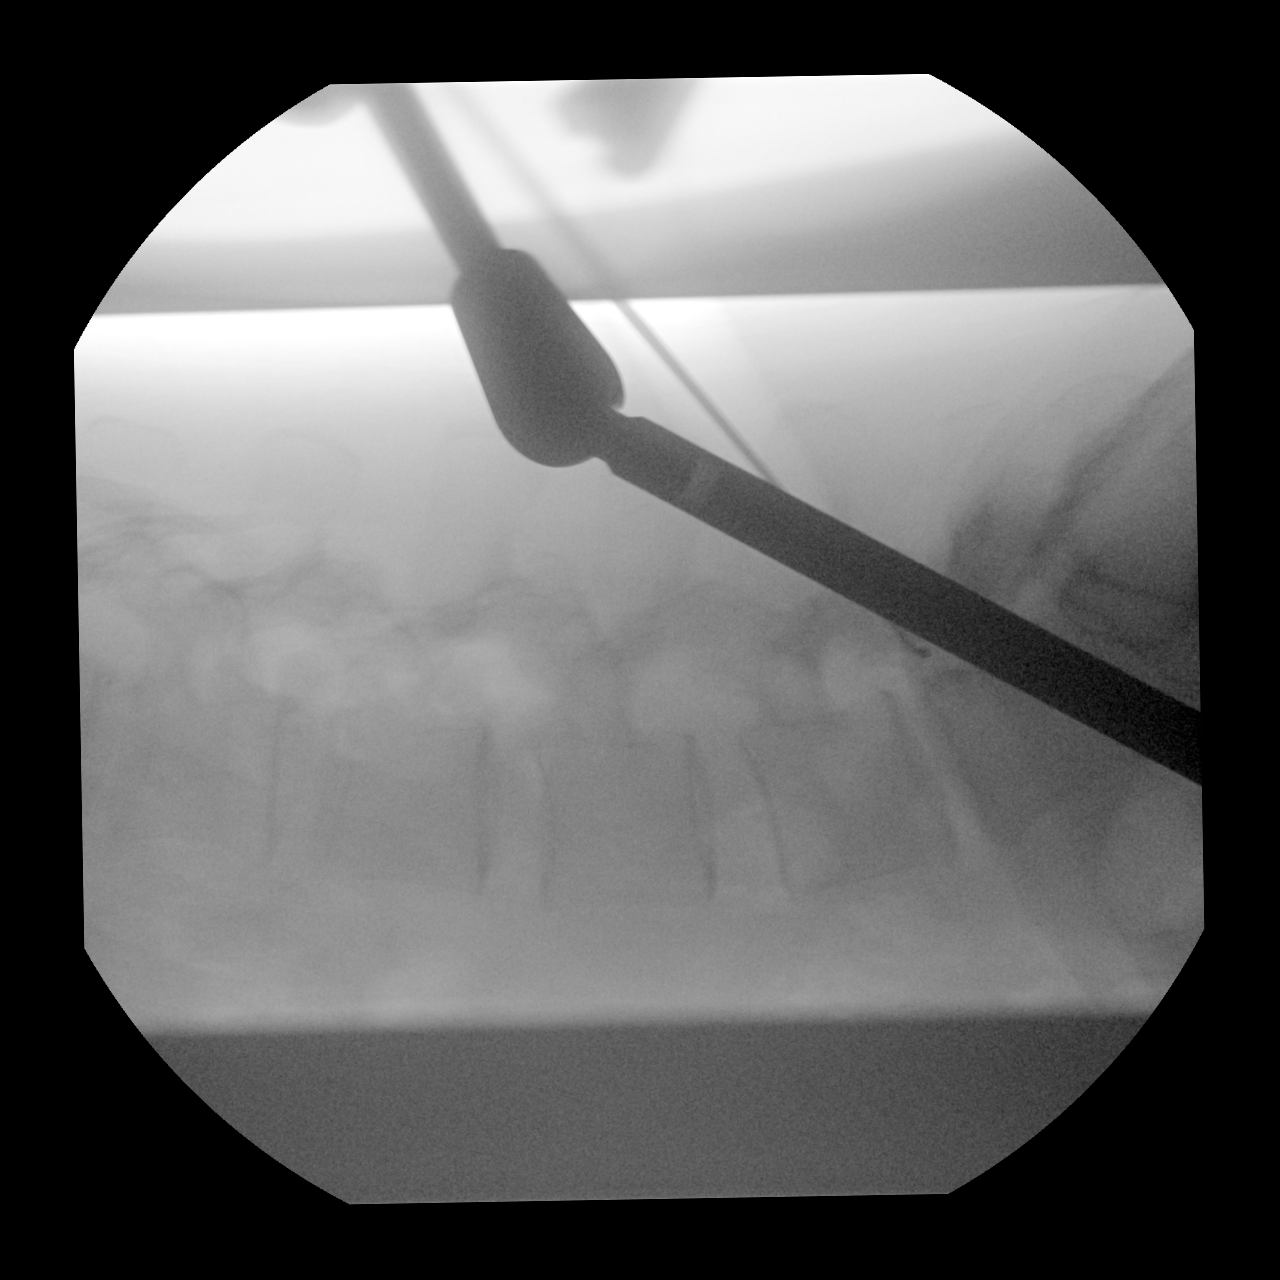

[1 of 1 positions shown; findings below may reference images not displayed]

FINDINGS: Single lateral intraoperative view demonstrates a localizer
overlying the spinal canal at the level of the superior L5 endplate.
IMPRESSION: Fluoroscopic guidance with localizer at the superior L5 level.

## 2019-11-20 IMAGING — RF DG C-ARM 1-60 MIN
1 series · 1 of 1 positions shown · non-contrast
Comparison: None.

CLINICAL DATA: Redo right L4-L5 hemilaminectomy and discectomy

EXAM:
LUMBAR SPINE - 2-3 VIEW; DG C-ARM 1-60 MIN
FLUOROSCOPY TIME:  8 seconds

[Series 1: dg x-ray · 0.20mm/px · 1 of 1 slices shown]
[im 1/1]
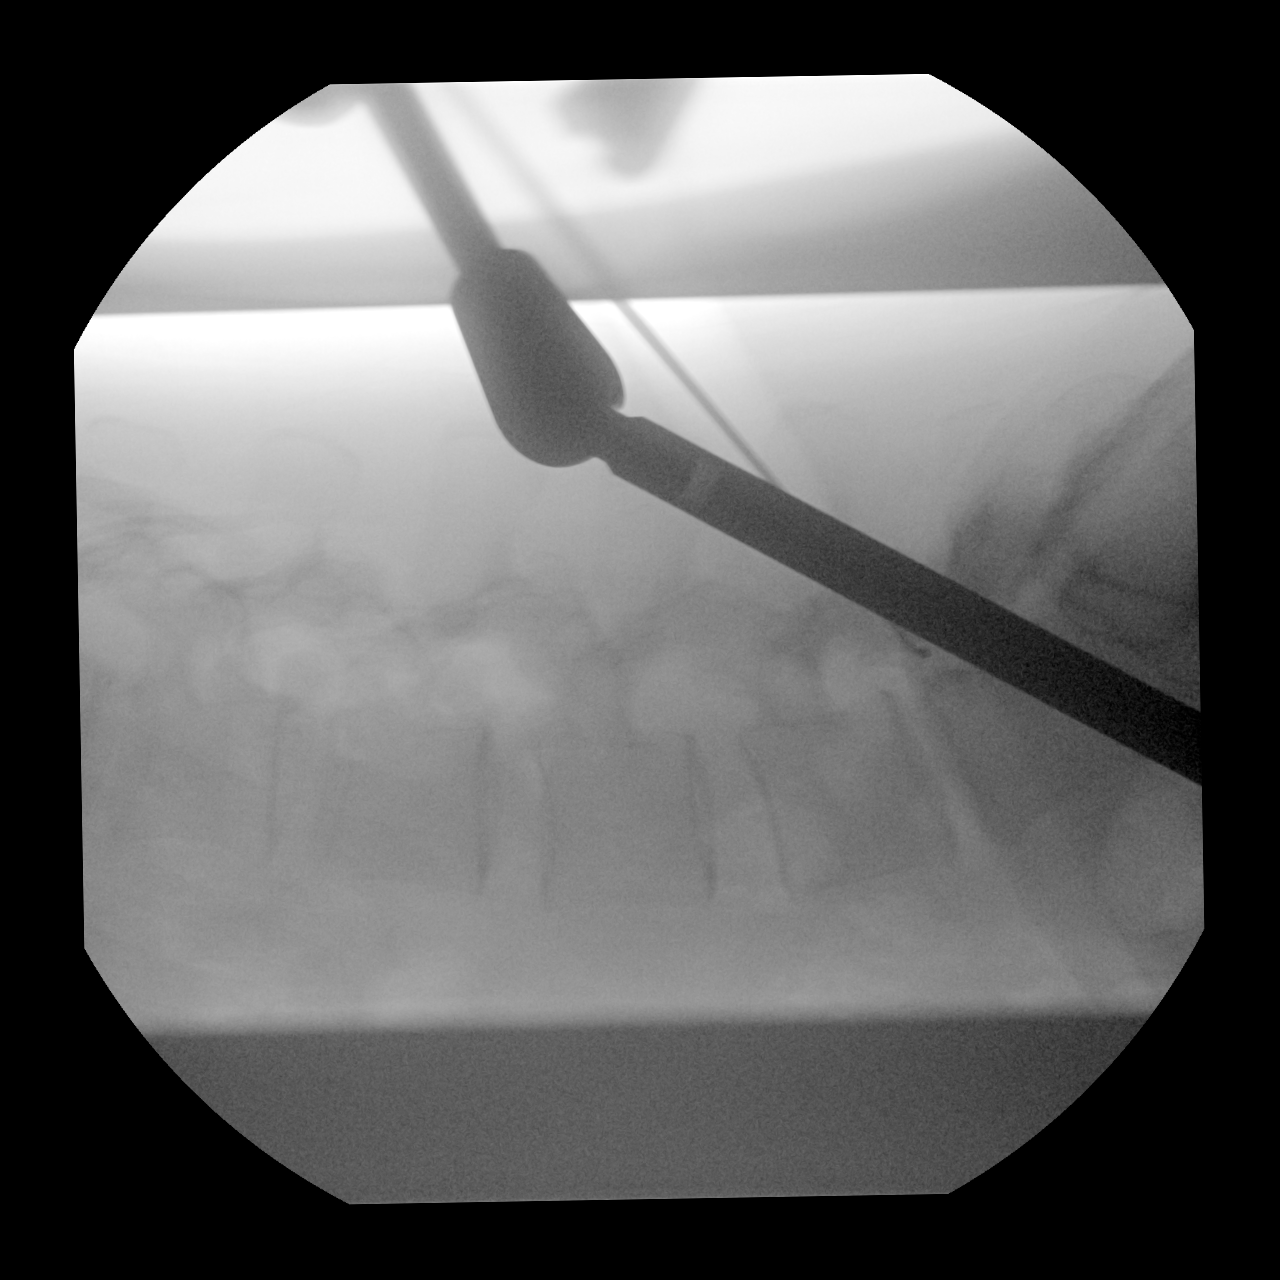

[1 of 1 positions shown; findings below may reference images not displayed]

FINDINGS: Single lateral intraoperative view demonstrates a localizer
overlying the spinal canal at the level of the superior L5 endplate.
IMPRESSION: Fluoroscopic guidance with localizer at the superior L5 level.

## 2019-11-20 SURGERY — HEMI-MICRODISCECTOMY LUMBAR LAMINECTOMY LEVEL 1
Anesthesia: General | Laterality: Right

## 2019-11-20 MED ORDER — OXYCODONE HCL 5 MG/5ML PO SOLN: 10.0000 mg | Freq: Once | ORAL | Status: AC | PRN

## 2019-11-20 MED ORDER — THROMBIN 5000 UNITS EX SOLR
CUTANEOUS | Status: DC | PRN
  Administered 2019-11-20: 5000 [IU] via TOPICAL

## 2019-11-20 MED ORDER — OXYCODONE HCL 5 MG PO TABS: 5.0000 mg | ORAL_TABLET | ORAL | 0 refills | Status: DC | PRN

## 2019-11-20 MED ORDER — OXYCODONE HCL 5 MG PO TABS
5.0000 mg | ORAL_TABLET | Freq: Once | ORAL | Status: AC
  Administered 2019-11-20: 5 mg via ORAL

## 2019-11-20 MED ORDER — REMIFENTANIL HCL 1 MG IV SOLR
INTRAVENOUS | Status: AC
  Filled 2019-11-20: qty 1000

## 2019-11-20 MED ORDER — MIDAZOLAM HCL 2 MG/2ML IJ SOLN
INTRAMUSCULAR | Status: AC
  Filled 2019-11-20: qty 2

## 2019-11-20 MED ORDER — PROPOFOL 10 MG/ML IV BOLUS
INTRAVENOUS | Status: DC | PRN
  Administered 2019-11-20: 150 mg via INTRAVENOUS

## 2019-11-20 MED ORDER — HYDROMORPHONE HCL 1 MG/ML IJ SOLN
INTRAMUSCULAR | Status: AC
  Filled 2019-11-20: qty 1

## 2019-11-20 MED ORDER — HYDROCORTISONE NA SUCCINATE PF 100 MG IJ SOLR
INTRAMUSCULAR | Status: DC | PRN
  Administered 2019-11-20: 75 mg via INTRAVENOUS

## 2019-11-20 MED ORDER — OXYCODONE HCL 5 MG PO TABS: 10.0000 mg | ORAL_TABLET | Freq: Once | ORAL | Status: AC | PRN

## 2019-11-20 MED ORDER — CEFAZOLIN SODIUM-DEXTROSE 2-4 GM/100ML-% IV SOLN
INTRAVENOUS | Status: AC
  Filled 2019-11-20: qty 100

## 2019-11-20 MED ORDER — PROPOFOL 500 MG/50ML IV EMUL
INTRAVENOUS | Status: AC
  Filled 2019-11-20: qty 50

## 2019-11-20 MED ORDER — OXYCODONE HCL 5 MG PO TABS
ORAL_TABLET | ORAL | Status: AC
  Filled 2019-11-20: qty 1

## 2019-11-20 MED ORDER — ACETAMINOPHEN 10 MG/ML IV SOLN
INTRAVENOUS | Status: DC | PRN
  Administered 2019-11-20: 1000 mg via INTRAVENOUS

## 2019-11-20 MED ORDER — FENTANYL CITRATE (PF) 100 MCG/2ML IJ SOLN: 25.0000 ug | INTRAMUSCULAR | Status: DC | PRN

## 2019-11-20 MED ORDER — TRIAMCINOLONE ACETONIDE 40 MG/ML IJ SUSP
INTRAMUSCULAR | Status: DC | PRN
  Administered 2019-11-20: 40 mg

## 2019-11-20 MED ORDER — FAMOTIDINE 20 MG PO TABS: 20.0000 mg | ORAL_TABLET | Freq: Once | ORAL | Status: AC

## 2019-11-20 MED ORDER — ROCURONIUM BROMIDE 100 MG/10ML IV SOLN
INTRAVENOUS | Status: DC | PRN
  Administered 2019-11-20: 25 mg via INTRAVENOUS

## 2019-11-20 MED ORDER — PROMETHAZINE HCL 25 MG/ML IJ SOLN: 6.2500 mg | INTRAMUSCULAR | Status: DC | PRN

## 2019-11-20 MED ORDER — FENTANYL CITRATE (PF) 100 MCG/2ML IJ SOLN
INTRAMUSCULAR | Status: AC
  Filled 2019-11-20: qty 2

## 2019-11-20 MED ORDER — OXYCODONE HCL 5 MG PO TABS
ORAL_TABLET | ORAL | Status: AC
  Administered 2019-11-20: 5 mg via ORAL
  Filled 2019-11-20: qty 1

## 2019-11-20 MED ORDER — LACTATED RINGERS IV SOLN: INTRAVENOUS | Status: DC

## 2019-11-20 MED ORDER — DEXAMETHASONE SODIUM PHOSPHATE 10 MG/ML IJ SOLN
INTRAMUSCULAR | Status: DC | PRN
  Administered 2019-11-20: 10 mg via INTRAVENOUS

## 2019-11-20 MED ORDER — SODIUM CHLORIDE 0.9 % IV SOLN
INTRAVENOUS | Status: DC | PRN
  Administered 2019-11-20: 25 ug/min via INTRAVENOUS

## 2019-11-20 MED ORDER — CEFAZOLIN SODIUM-DEXTROSE 2-4 GM/100ML-% IV SOLN
2.0000 g | Freq: Once | INTRAVENOUS | Status: AC
  Administered 2019-11-20: 2 g via INTRAVENOUS

## 2019-11-20 MED ORDER — FENTANYL CITRATE (PF) 100 MCG/2ML IJ SOLN
INTRAMUSCULAR | Status: DC | PRN
  Administered 2019-11-20 (×2): 50 ug via INTRAVENOUS

## 2019-11-20 MED ORDER — SUCCINYLCHOLINE CHLORIDE 20 MG/ML IJ SOLN
INTRAMUSCULAR | Status: DC | PRN
  Administered 2019-11-20: 120 mg via INTRAVENOUS

## 2019-11-20 MED ORDER — SODIUM CHLORIDE 0.9 % IV SOLN
INTRAVENOUS | Status: DC | PRN
  Administered 2019-11-20: .08 ug/kg/min via INTRAVENOUS

## 2019-11-20 MED ORDER — ONDANSETRON HCL 4 MG/2ML IJ SOLN
INTRAMUSCULAR | Status: DC | PRN
  Administered 2019-11-20 (×2): 4 mg via INTRAVENOUS

## 2019-11-20 MED ORDER — MIDAZOLAM HCL 2 MG/2ML IJ SOLN
INTRAMUSCULAR | Status: DC | PRN
  Administered 2019-11-20 (×2): 2 mg via INTRAVENOUS

## 2019-11-20 MED ORDER — LIDOCAINE HCL (CARDIAC) PF 100 MG/5ML IV SOSY
PREFILLED_SYRINGE | INTRAVENOUS | Status: DC | PRN
  Administered 2019-11-20: 100 mg via INTRAVENOUS

## 2019-11-20 MED ORDER — ACETAMINOPHEN 10 MG/ML IV SOLN
INTRAVENOUS | Status: AC
  Filled 2019-11-20: qty 100

## 2019-11-20 MED ORDER — SUGAMMADEX SODIUM 200 MG/2ML IV SOLN
INTRAVENOUS | Status: DC | PRN
  Administered 2019-11-20: 167.8 mg via INTRAVENOUS

## 2019-11-20 MED ORDER — HYDROMORPHONE HCL 1 MG/ML IJ SOLN
INTRAMUSCULAR | Status: DC | PRN
  Administered 2019-11-20: 1 mg via INTRAVENOUS

## 2019-11-20 MED ORDER — FAMOTIDINE 20 MG PO TABS
ORAL_TABLET | ORAL | Status: AC
  Administered 2019-11-20: 20 mg via ORAL
  Filled 2019-11-20: qty 1

## 2019-11-20 SURGICAL SUPPLY — 59 items
BUR NEURO DRILL SOFT 3.0X3.8M (BURR) ×3 IMPLANT
CANISTER SUCT 1200ML W/VALVE (MISCELLANEOUS) ×6 IMPLANT
CHLORAPREP W/TINT 26 (MISCELLANEOUS) ×6 IMPLANT
COUNTER NEEDLE 20/40 LG (NEEDLE) ×3 IMPLANT
COVER LIGHT HANDLE STERIS (MISCELLANEOUS) ×6 IMPLANT
COVER WAND RF STERILE (DRAPES) ×3 IMPLANT
CUP MEDICINE 2OZ PLAST GRAD ST (MISCELLANEOUS) ×3 IMPLANT
DERMABOND ADVANCED (GAUZE/BANDAGES/DRESSINGS) ×2
DERMABOND ADVANCED .7 DNX12 (GAUZE/BANDAGES/DRESSINGS) ×1 IMPLANT
DRAPE C-ARM 42X72 X-RAY (DRAPES) ×6 IMPLANT
DRAPE LAPAROTOMY 100X77 ABD (DRAPES) ×3 IMPLANT
DRAPE MICROSCOPE SPINE 48X150 (DRAPES) IMPLANT
DRAPE SURG 17X11 SM STRL (DRAPES) ×3 IMPLANT
DRSG TEGADERM 4X4.75 (GAUZE/BANDAGES/DRESSINGS) IMPLANT
DRSG TELFA 4X3 1S NADH ST (GAUZE/BANDAGES/DRESSINGS) IMPLANT
DURASEAL APPLICATOR TIP (TIP) IMPLANT
DURASEAL SPINE SEALANT 3ML (MISCELLANEOUS) IMPLANT
ELECT CAUTERY BLADE TIP 2.5 (TIP) ×3
ELECT EZSTD 165MM 6.5IN (MISCELLANEOUS) ×3
ELECT REM PT RETURN 9FT ADLT (ELECTROSURGICAL) ×3
ELECTRODE CAUTERY BLDE TIP 2.5 (TIP) ×1 IMPLANT
ELECTRODE EZSTD 165MM 6.5IN (MISCELLANEOUS) ×1 IMPLANT
ELECTRODE REM PT RTRN 9FT ADLT (ELECTROSURGICAL) ×1 IMPLANT
GAUZE SPONGE 4X4 12PLY STRL (GAUZE/BANDAGES/DRESSINGS) ×3 IMPLANT
GLOVE BIOGEL PI IND STRL 7.0 (GLOVE) ×1 IMPLANT
GLOVE BIOGEL PI INDICATOR 7.0 (GLOVE) ×2
GLOVE INDICATOR 8.0 STRL GRN (GLOVE) ×9 IMPLANT
GLOVE SURG SYN 7.0 (GLOVE) ×6 IMPLANT
GLOVE SURG SYN 8.0 (GLOVE) ×3 IMPLANT
GOWN STRL REUS W/ TWL XL LVL3 (GOWN DISPOSABLE) ×1 IMPLANT
GOWN STRL REUS W/TWL MED LVL3 (GOWN DISPOSABLE) ×3 IMPLANT
GOWN STRL REUS W/TWL XL LVL3 (GOWN DISPOSABLE) ×2
GRADUATE 1200CC STRL 31836 (MISCELLANEOUS) ×3 IMPLANT
IV CATH ANGIO 12GX3 LT BLUE (NEEDLE) ×3 IMPLANT
KIT TURNOVER KIT A (KITS) ×3 IMPLANT
KIT WILSON FRAME (KITS) ×3 IMPLANT
KNIFE BAYONET SHORT DISCETOMY (MISCELLANEOUS) IMPLANT
MARKER SKIN DUAL TIP RULER LAB (MISCELLANEOUS) ×6 IMPLANT
NDL SAFETY ECLIPSE 18X1.5 (NEEDLE) ×1 IMPLANT
NEEDLE HYPO 18GX1.5 SHARP (NEEDLE) ×2
NEEDLE HYPO 22GX1.5 SAFETY (NEEDLE) ×3 IMPLANT
NS IRRIG 1000ML POUR BTL (IV SOLUTION) ×3 IMPLANT
PACK LAMINECTOMY NEURO (CUSTOM PROCEDURE TRAY) ×3 IMPLANT
PAD ARMBOARD 7.5X6 YLW CONV (MISCELLANEOUS) ×3 IMPLANT
SPOGE SURGIFLO 8M (HEMOSTASIS) ×2
SPONGE SURGIFLO 8M (HEMOSTASIS) ×1 IMPLANT
STAPLER SKIN PROX 35W (STAPLE) IMPLANT
SUT NURALON 4 0 TR CR/8 (SUTURE) IMPLANT
SUT POLYSORB 2-0 5X18 GS-10 (SUTURE) ×6 IMPLANT
SUT VIC AB 0 CT1 18XCR BRD 8 (SUTURE) ×1 IMPLANT
SUT VIC AB 0 CT1 8-18 (SUTURE) ×2
SYR 10ML LL (SYRINGE) ×6 IMPLANT
SYR 30ML LL (SYRINGE) ×3 IMPLANT
SYR 3ML LL SCALE MARK (SYRINGE) ×3 IMPLANT
TOWEL OR 17X26 4PK STRL BLUE (TOWEL DISPOSABLE) ×12 IMPLANT
TUBE MATRX SPINL 18MM 6CM DISP (INSTRUMENTS) ×2
TUBE METRX SPINAL 18X6 DISP (INSTRUMENTS) ×1 IMPLANT
TUBING CONNECTING 10 (TUBING) ×2 IMPLANT
TUBING CONNECTING 10' (TUBING) ×1

## 2019-11-20 NOTE — Anesthesia Postprocedure Evaluation (Signed)
Anesthesia Post Note  Patient: Sandy Figueroa  Procedure(s) Performed: REDO RIGHT L4-5 HEMILAMINECTOMY AND DISCECTOMY (Right )  Patient location during evaluation: PACU Anesthesia Type: General Level of consciousness: awake and alert Pain management: pain level controlled Vital Signs Assessment: post-procedure vital signs reviewed and stable Respiratory status: spontaneous breathing, nonlabored ventilation and respiratory function stable Cardiovascular status: blood pressure returned to baseline and stable Postop Assessment: no apparent nausea or vomiting Anesthetic complications: no     Last Vitals:  Vitals:   11/20/19 1222 11/20/19 1309  BP: (!) 155/108 (!) 141/111  Pulse:  98  Resp:    Temp:    SpO2:      Last Pain:  Vitals:   11/20/19 1309  TempSrc:   PainSc: 2                  Karleen Hampshire

## 2019-11-20 NOTE — Anesthesia Procedure Notes (Signed)
Procedure Name: Intubation Date/Time: 11/20/2019 9:18 AM Performed by: Nelda Marseille, CRNA Pre-anesthesia Checklist: Patient identified, Patient being monitored, Timeout performed, Emergency Drugs available and Suction available Patient Re-evaluated:Patient Re-evaluated prior to induction Oxygen Delivery Method: Circle system utilized Preoxygenation: Pre-oxygenation with 100% oxygen Induction Type: IV induction Ventilation: Mask ventilation without difficulty Laryngoscope Size: Mac, 3 and McGraph Grade View: Grade I Tube type: Oral Tube size: 7.5 mm Number of attempts: 1 Airway Equipment and Method: Stylet Placement Confirmation: ETT inserted through vocal cords under direct vision,  positive ETCO2 and breath sounds checked- equal and bilateral Secured at: 21 cm Tube secured with: Tape Dental Injury: Teeth and Oropharynx as per pre-operative assessment

## 2019-11-20 NOTE — Transfer of Care (Signed)
Immediate Anesthesia Transfer of Care Note  Patient: Sandy Figueroa  Procedure(s) Performed: REDO RIGHT L4-5 HEMILAMINECTOMY AND DISCECTOMY (Right )  Patient Location: PACU  Anesthesia Type:General  Level of Consciousness: sedated  Airway & Oxygen Therapy: Patient Spontanous Breathing and Patient connected to face mask oxygen  Post-op Assessment: Report given to RN and Post -op Vital signs reviewed and stable  Post vital signs: Reviewed and stable  Last Vitals:  Vitals Value Taken Time  BP    Temp    Pulse 91 11/20/19 1047  Resp 20 11/20/19 1047  SpO2 100 % 11/20/19 1047  Vitals shown include unvalidated device data.  Last Pain:  Vitals:   11/20/19 0835  TempSrc: Tympanic  PainSc: 1          Complications: No apparent anesthesia complications

## 2019-11-20 NOTE — Discharge Instructions (Addendum)
NEUROSURGERY DISCHARGE INSTRUCTIONS  Admission diagnosis: m54.41 - Acute right-sided low back pain with right-sided sciatica m54.16 - lumbar radiculopathy  Operative procedure: Secondary L4/5 Hemilaminectomy and Discectomy  What to do after you leave the hospital:  Recommended diet: regular diet. Increase protein intake to promote wound healing.  Recommended activity: no heavy lifting, pushing, pulling, rotation, or bending. It is encouraged to walk daily   Oxycodone 5mg  taken March 29 at 11:35am. Next dose if needed at 3:35pm  Special Instructions  No straining, no heavy lifting > 10lbs x 4 weeks.  Keep incision area clean and dry. May Shower tomorrow.  No bath or soaking in tub for 6 weeks. You have absorbable sutures with adhesive on skin. Nothing to remove.  Please Report any of the following: Nausea or Vomiting, Temperature is greater than 101.79F (38.1C) degrees, Dizziness, Difficulty Breathing or Shortness of Breath, Inability to Eat, drink Fluids, or Take medications, Bleeding, swelling, or drainage from surgical incision sites, New numbness or weakness, Seizures, Altered mental status and Bowel or bladder dysfunction to the neurosurgery resident on-call at [phone]  Additional Follow up appointments Please keep scheduled appointment with [facility] clinic for follow up on 4/13   AMBULATORY SURGERY  DISCHARGE INSTRUCTIONS   1) The drugs that you were given will stay in your system until tomorrow so for the next 24 hours you should not:  A) Drive an automobile B) Make any legal decisions C) Drink any alcoholic beverage   2) You may resume regular meals tomorrow.  Today it is better to start with liquids and gradually work up to solid foods.  You may eat anything you prefer, but it is better to start with liquids, then soup and crackers, and gradually work up to solid foods.   3) Please notify your doctor immediately if you have any unusual bleeding, trouble  breathing, redness and pain at the surgery site, drainage, fever, or pain not relieved by medication.    4) Additional Instructions:        Please contact your physician with any problems or Same Day Surgery at [phone], Monday through Friday 6 am to 4 pm, or [place] at [facility] number at [phone].

## 2019-11-20 NOTE — H&P (Signed)
Sandy Figueroa is an 39 y.o. female.   Chief Complaint: Right leg pain HPI: Sandy Figueroa is here for evaluation now 5 weeks out from her lumbar discectomy. She states she was doing well overall and had been weaning her medications but did notice 5 days ago that she started having more sharp pain going down the right leg towards the calf and ankle. She continues to have the decreased sensation in those areas in the distal leg that she was having since the last surgery. She currently is taken intermittent Flexeril and oxycodone for pain but does come off the Lyrica completely. Her vision has improved while being off of this and she does not want to go back on. She did start a prednisone taper 2 days ago but has not noticed a tremendous amount relief. However, she does state it is helped some. She has no obvious weakness. She has no left leg pain. MRI showed a large re-herniation at the L4/5 level on the right and we discussed exploration and discectomy for relief   Past Medical History:  Diagnosis Date  . Depression   . History of kidney stones     Past Surgical History:  Procedure Laterality Date  . SPINE SURGERY    . WISDOM TOOTH EXTRACTION      Family History  Problem Relation Age of Onset  . Hyperlipidemia Father   . Hypertension Father   . Kidney disease Father   . Heart disease Father   . Breast cancer Neg Hx   . Ovarian cancer Neg Hx    Social History:  reports that she quit smoking about 13 years ago. Her smoking use included cigarettes. She has never used smokeless tobacco. She reports current alcohol use. She reports that she does not use drugs.  Allergies:  Allergies  Allergen Reactions  . Methylprednisolone Other (See Comments)    Vision loss Was able to tolerate prednisone    Medications Prior to Admission  Medication Sig Dispense Refill  . cyclobenzaprine (FLEXERIL) 10 MG tablet Take 10 mg by mouth at bedtime.    Marland Kitchen HYDROcodone-acetaminophen (NORCO/VICODIN) 5-325 MG  tablet Take 1 tablet by mouth 2 (two) times daily as needed for pain.    . Lidocaine 4 % PTCH Place 1 patch onto the skin daily as needed (pain.).    Marland Kitchen Multiple Vitamins-Minerals (MULTIVITAMIN ADULTS) TABS Take 1 packet by mouth daily. doTERRA Lifelong Vitality Pack    . oxyCODONE (OXY IR/ROXICODONE) 5 MG immediate release tablet Take 5 mg by mouth 4 (four) times daily as needed for pain.    . predniSONE (DELTASONE) 5 MG tablet Take 10-30 mg by mouth See admin instructions. Take 6 tablets by mouth daily for 4 days, take 4 tablets for 4 days, & take 2 tablets for 4 days.    Marland Kitchen oxyCODONE-acetaminophen (PERCOCET) 5-325 MG tablet Take 1 tablet by mouth every 4 (four) hours as needed for severe pain. (Patient not taking: Reported on 11/13/2019) 15 tablet 0    Results for orders placed or performed during the hospital encounter of 11/20/19 (from the past 48 hour(s))  Pregnancy, urine POC     Status: None   Collection Time: 11/20/19  8:12 AM  Result Value Ref Range   Preg Test, Ur NEGATIVE NEGATIVE    Comment:        THE SENSITIVITY OF THIS METHODOLOGY IS >24 mIU/mL    No results found.  Review of Systems General ROS: Negative Respiratory ROS: Negative Cardiovascular ROS: Negative Gastrointestinal  ROS: Negative Genito-Urinary ROS: Negative Musculoskeletal ROS: Negative Neurological ROS: Positive for right leg pain, numbness Dermatological ROS: Negative  Last menstrual period 10/25/2019, unknown if currently breastfeeding. Physical Exam  General appearance: Alert, cooperative, in obvious pain Back: Well healed right paramedian incision CV: Regular rate and rhythm, Pulm: Clear to auscultation  Neurologic exam:  Mental status: alertness: alert, affect: Tearful at times Speech: fluent and clear Motor:strength symmetric 5/5 in bilateral hip flexion, knee flexion, knee extension, dorsiflex, plantar flexion with exception of 4+/5 in right EHL Sensory: Decreased light touch of the medial and  lateral calf on the right leg Gait: Not tested  Assessment/Plan Plan is for secondary L4/5 discectomy  Lucy Chris, MD 11/20/2019, 8:27 AM

## 2019-11-20 NOTE — Interval H&P Note (Signed)
History and Physical Interval Note:  11/20/2019 8:29 AM  Sandy Figueroa  has presented today for surgery, with the diagnosis of m54.41 - Acute right-sided low back pain with right-sided sciatica m54.16 - lumbar radiculopathy.  The various methods of treatment have been discussed with the patient and family. After consideration of risks, benefits and other options for treatment, the patient has consented to  Procedure(s): REDO RIGHT L4-5 HEMILAMINECTOMY AND DISCECTOMY (Right) as a surgical intervention.  The patient's history has been reviewed, patient examined, no change in status, stable for surgery.  I have reviewed the patient's chart and labs.  Questions were answered to the patient's satisfaction.     Lucy Chris

## 2019-11-20 NOTE — Progress Notes (Signed)
Pharmacy Antibiotic Note  Sandy Figueroa is a 39 y.o. female admitted on 11/20/2019 with surgical prophylaxis.  Pharmacy has been consulted for Cefazolin dosing.  Recorded Weight: 83.9 kg  Plan: Cefazolin 2g IV preop     No data recorded.  No results for input(s): WBC, CREATININE, LATICACIDVEN, VANCOTROUGH, VANCOPEAK, VANCORANDOM, GENTTROUGH, GENTPEAK, GENTRANDOM, TOBRATROUGH, TOBRAPEAK, TOBRARND, AMIKACINPEAK, AMIKACINTROU, AMIKACIN in the last 168 hours.  CrCl cannot be calculated (Patient's most recent lab result is older than the maximum 21 days allowed.).    Allergies  Allergen Reactions  . Methylprednisolone Other (See Comments)    Vision loss Was able to tolerate prednisone    Thank you for allowing pharmacy to be a part of this patient's care.  Clovia Cuff, PharmD, BCPS 11/20/2019 8:20 AM

## 2019-11-20 NOTE — Op Note (Signed)
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 MRI revealeda large recurrent disc herniation on the right at L4/5.We discussed secondary decompression at that level.Therisks of surgery were explained to include hematoma, infection, damage to nerve roots, CSF leak, weakness, numbness, pain, need for future surgery including fusion, heart attack, and stroke.Sheelected to proceed with surgery for symptom relief.  Procedure The patient was brought to the OR after informed consent was obtained.She was given general anesthesia and intubated by the anesthesia service. Vascular access lines were placed.The patient was then placed prone on a Wilson frameensuring all pressure points were padded.Antibiotics were administered.A time-out was performed per protocol.   The patient was sterilely prepped and draped. The priorincision was planned1.5cm off midlineon theright. The skin was opened sharply and the dissection taken to the fascia. This was incised and initial dilator placedthe spinous processes and lamina of L4on therightout to the medial edge of the facet. Serial dilatorswere inserted via fluoroscopy and the final26mm tube was placed at depth of6cm.  The microscope was brought into the field. The overlying muscle was removed from lamina and medial  facet.Next, a matchstickdrill bit was used to remove theL4lamina centrallyand going laterally above the prior defect.The underlying ligament was freed and removed with combination of rongeurs.The dura was seen to be free at that level. The dura was followed inferiorly where it was compressed by scar and a large firm disc herniation. Using blunt probes and curettes, the dura was dissected free from this scar/disc until the disc herniation was removed in one piece. The epidural space was inspected and more soft disc material was seen. Next, the dura was retracted medially and the disc space irrigated with removal of additional fragments. The epidural space was again inspected and found to be flat. The dura was seen to be full and intact.Beneath this, the traversing S1 nerve root was seen and no further compression seen.The ligament was removed following the S1 nerve out laterally until a ball tip probe passed freely.Hemostasis was obtained and wound irrigated. Kenolog was placed along thecal sac and nerve root.The working channel was removed.  The microscope was removed.Thefasciawas then closed using 0vicryl followed by thesubcutaneous and dermal layers with 2-0 vicryluntil the epidermis was well approximated. The skin was closed withDermabond..  The patient was returned to supine position and extubated by the anesthesia service. The patient was then taken to the PACU for post-operative care whereshe was moving extremities symmetrically.   ESTIMATED BLOOD LOSS: 20cc  SPECIMENS None  IMPLANT None   I performed the case in its entiretywith assistance of PA, [facility]  Lucy Chris, MD [phone]

## 2019-11-20 NOTE — Anesthesia Preprocedure Evaluation (Addendum)
Anesthesia Evaluation  Patient identified by MRN, date of birth, ID band Patient awake    Reviewed: Allergy & Precautions, H&P , NPO status , Patient's Chart, lab work & pertinent test results  History of Anesthesia Complications Negative for: history of anesthetic complications  Airway Mallampati: II  TM Distance: <3 FB Neck ROM: full    Dental  (+) Teeth Intact   Pulmonary neg shortness of breath, former smoker,    breath sounds clear to auscultation       Cardiovascular (-) anginanegative cardio ROS  (-) dysrhythmias  Rhythm:regular Rate:Tachycardia     Neuro/Psych PSYCHIATRIC DISORDERS Depression negative neurological ROS     GI/Hepatic negative GI ROS, Neg liver ROS,   Endo/Other  negative endocrine ROS  Renal/GU      Musculoskeletal   Abdominal   Peds  Hematology negative hematology ROS (+)   Anesthesia Other Findings Obese Appears anxious and in pain Has taken several courses of steroids in the past year, currently on prednisone taper  Past Medical History: No date: Depression No date: History of kidney stones  Past Surgical History: No date: SPINE SURGERY No date: WISDOM TOOTH EXTRACTION     Reproductive/Obstetrics negative OB ROS                            Anesthesia Physical Anesthesia Plan  ASA: II  Anesthesia Plan: General ETT   Post-op Pain Management:    Induction:   PONV Risk Score and Plan: Ondansetron, Dexamethasone, Midazolam, Treatment may vary due to age or medical condition and Propofol infusion  Airway Management Planned:   Additional Equipment:   Intra-op Plan:   Post-operative Plan:   Informed Consent: I have reviewed the patients History and Physical, chart, labs and discussed the procedure including the risks, benefits and alternatives for the proposed anesthesia with the patient or authorized representative who has indicated his/her  understanding and acceptance.     Dental Advisory Given  Plan Discussed with: Anesthesiologist  Anesthesia Plan Comments:        Anesthesia Quick Evaluation

## 2019-11-20 NOTE — OR Nursing (Signed)
Dr. Orland Penman notified of elevated BP and pain score of 4/5 out of 10. She wants patient to get another 5 mg oxycodone.

## 2020-01-08 ENCOUNTER — Other Ambulatory Visit: Payer: Self-pay | Admitting: Neurosurgery

## 2020-01-08 DIAGNOSIS — M5441 Lumbago with sciatica, right side: Secondary | ICD-10-CM

## 2020-01-23 ENCOUNTER — Other Ambulatory Visit: Payer: Self-pay

## 2020-01-23 ENCOUNTER — Ambulatory Visit
Admission: RE | Admit: 2020-01-23 | Discharge: 2020-01-23 | Disposition: A | Discharge status: Home / Self Care | Payer: 59 | Source: Ambulatory Visit | Attending: Neurosurgery | Admitting: Neurosurgery

## 2020-01-23 DIAGNOSIS — M5441 Lumbago with sciatica, right side: Secondary | ICD-10-CM | POA: Insufficient documentation

## 2020-01-23 IMAGING — MR MR LUMBAR SPINE WO/W CM
6 of 7 series · 32 of 48 positions shown · IV contrast (gadavist)
Comparison: [DATE]

CLINICAL DATA: Recurrent right-sided back pain

EXAM:
MRI LUMBAR SPINE WITHOUT AND WITH CONTRAST
TECHNIQUE: Multiplanar and multiecho pulse sequences of the lumbar spine were
obtained without and with intravenous contrast.
CONTRAST:  7.5mL GADAVIST GADOBUTROL 1 MMOL/ML IV SOLN

[Series 5: T2 · sagittal · 4.0mm · 0.81mm/px · 5 of 16 slices shown (1 of 2)]
[im 1/16]
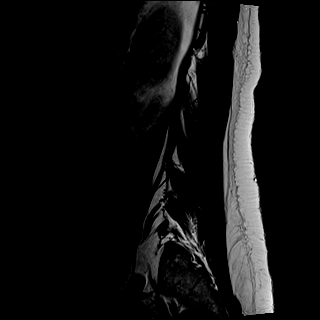
[im 4/16]
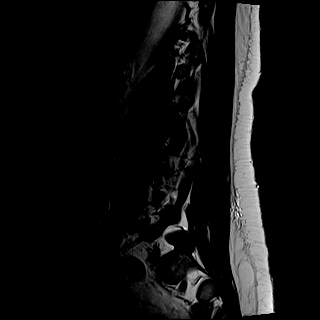
[im 8/16]
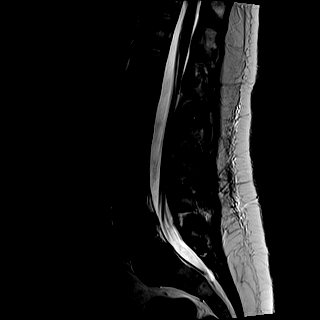
[im 12/16]
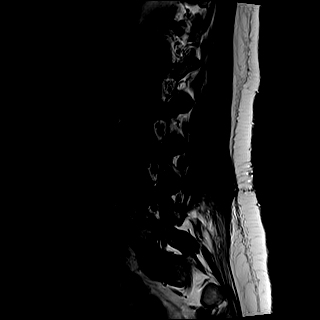
[im 16/16]
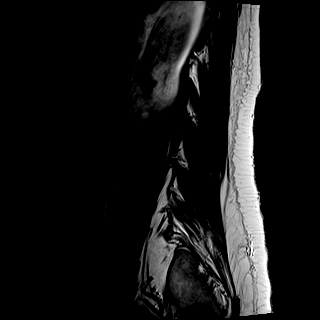

[Series 6: T1 · sagittal · 4.0mm · 0.81mm/px · 5 of 16 slices shown (1 of 2)]
[im 1/16]
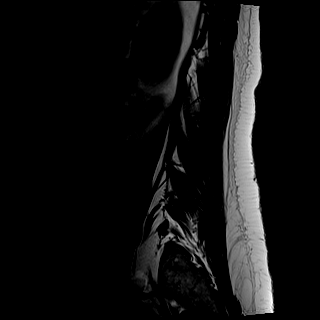
[im 4/16]
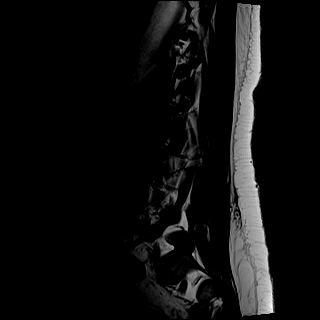
[im 8/16]
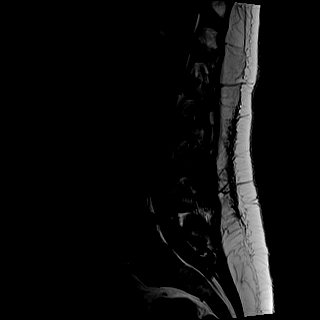
[im 12/16]
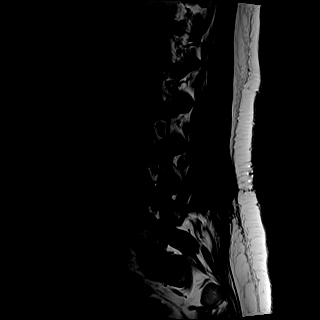
[im 16/16]
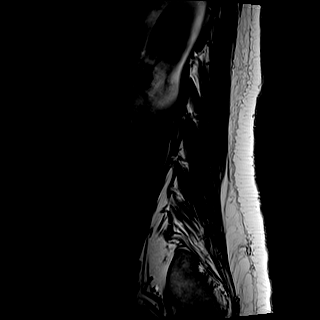

[Series 7: STIR · sagittal · 4.0mm · 0.41mm/px · 2 of 16 slices shown]
[im 1/16]
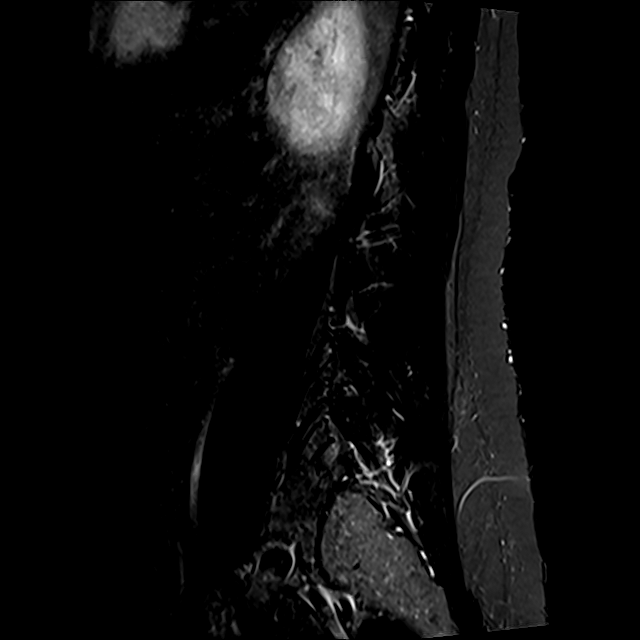
[im 6/16]
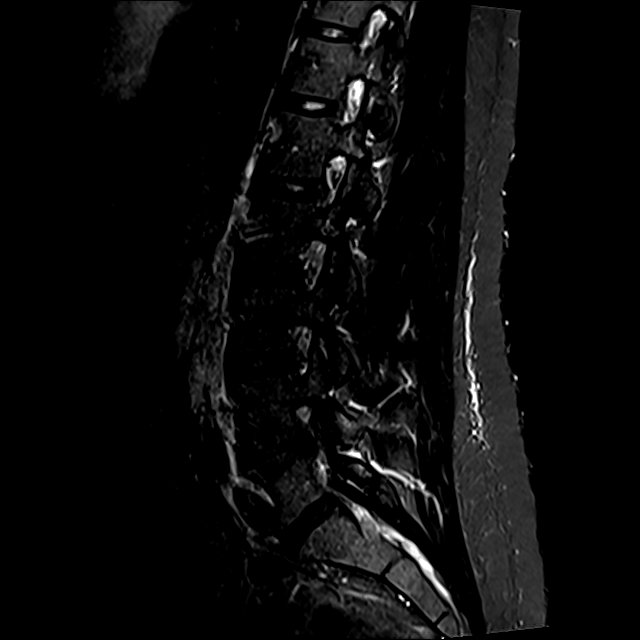

[Series 8: T2 · axial · 4.0mm · 0.78mm/px · z∈[-126,+90]mm · 8 of 38 slices shown (2 of 2)]
[im 1/38]
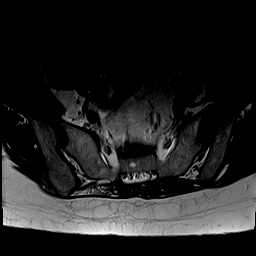
[im 5/38]
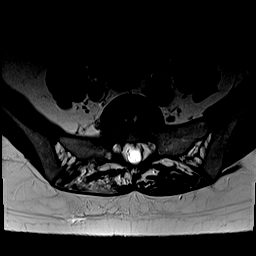
[im 13/38]
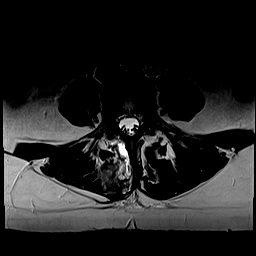
[im 17/38]
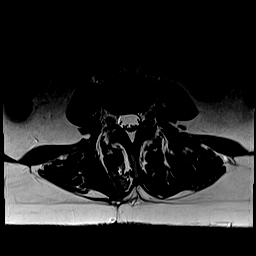
[im 21/38]
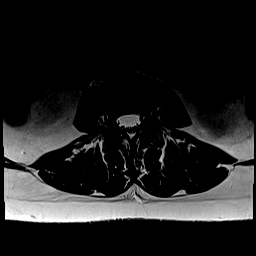
[im 25/38]
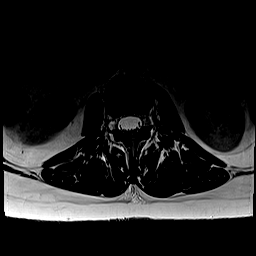
[im 33/38]
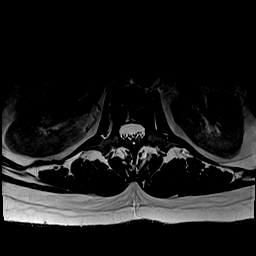
[im 38/38]
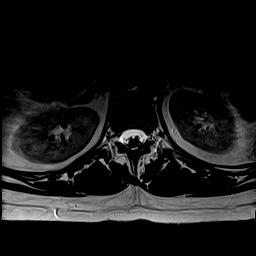

[Series 9: T1 · axial · 4.0mm · 0.39mm/px · z∈[-126,+90]mm · 8 of 38 slices shown (2 of 2)]
[im 1/38]
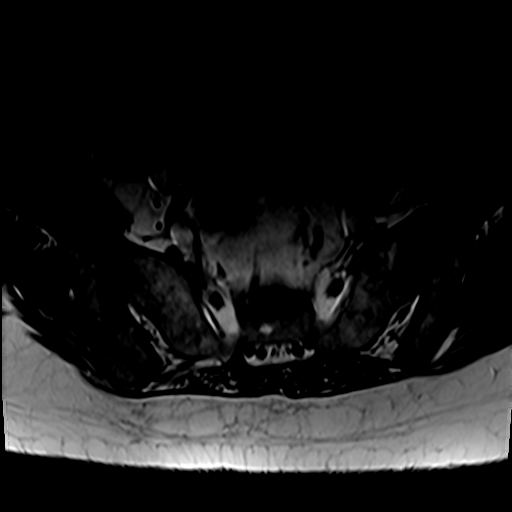
[im 5/38]
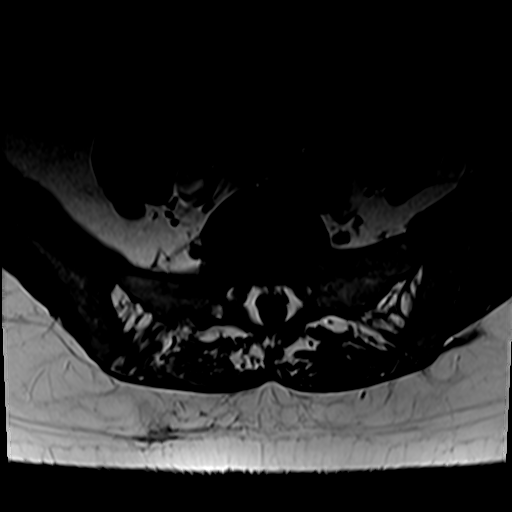
[im 13/38]
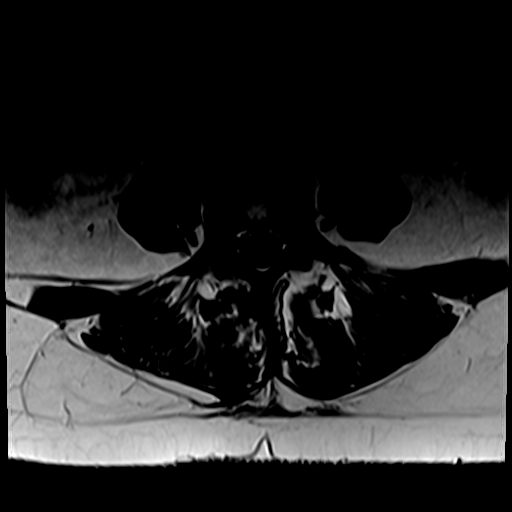
[im 17/38]
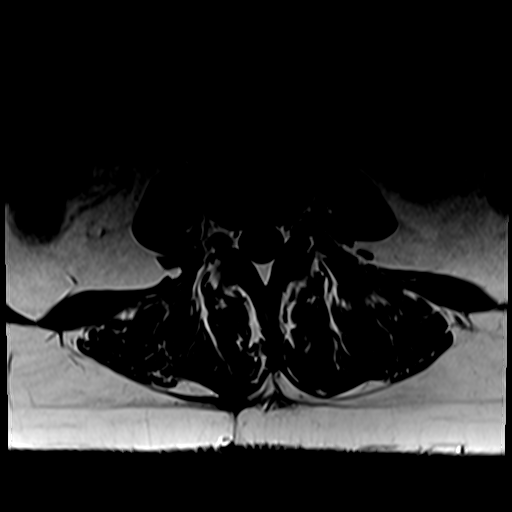
[im 21/38]
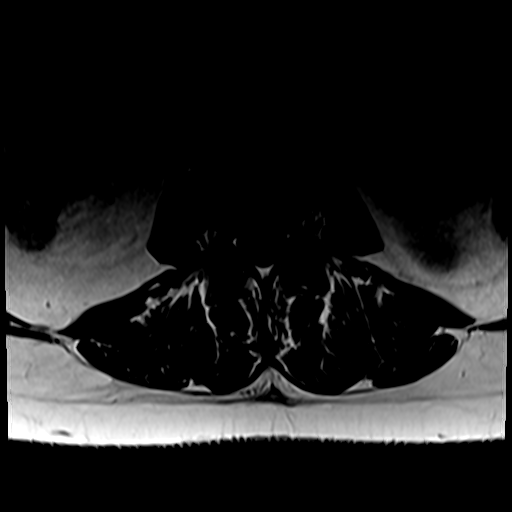
[im 25/38]
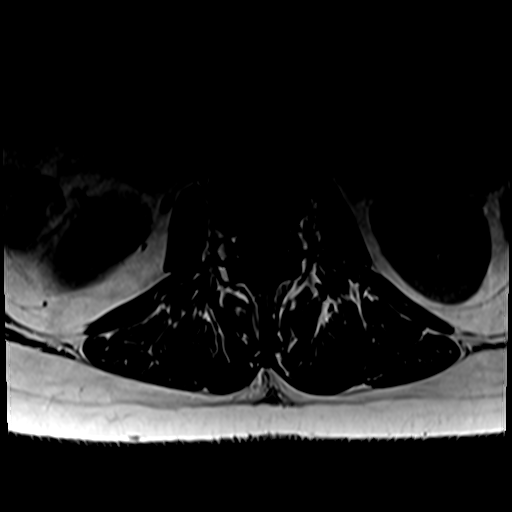
[im 33/38]
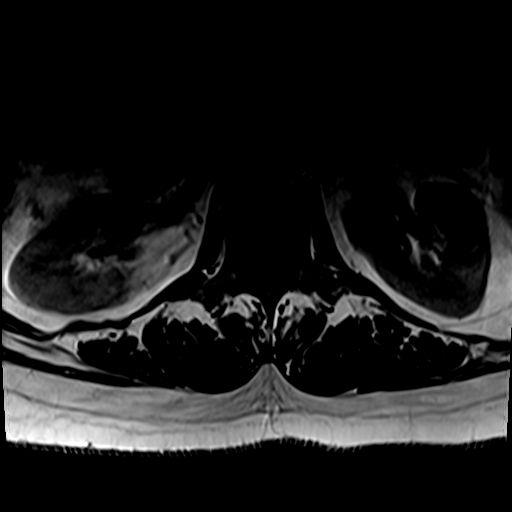
[im 38/38]
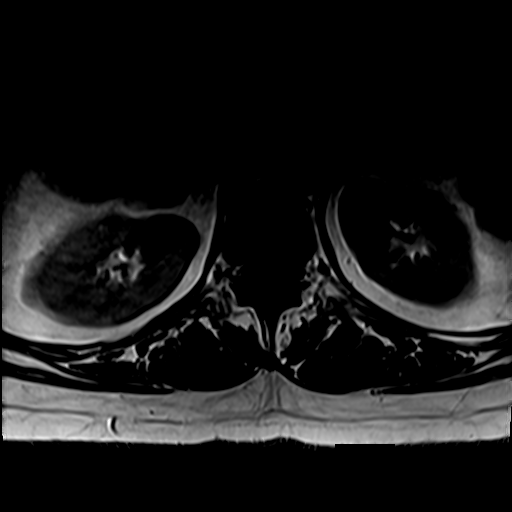

[Series 10: T1 fat-sat post-contrast · sagittal · 4.0mm · 0.81mm/px · 4 of 16 slices shown]
[im 1/16]
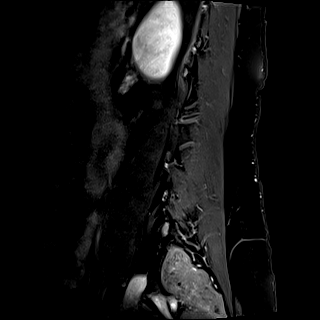
[im 6/16]
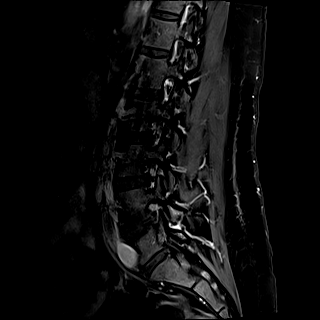
[im 11/16]
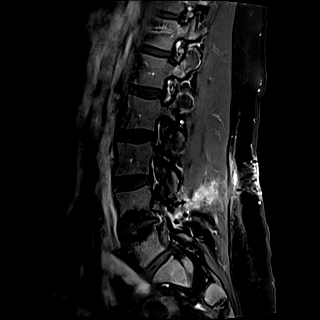
[im 16/16]
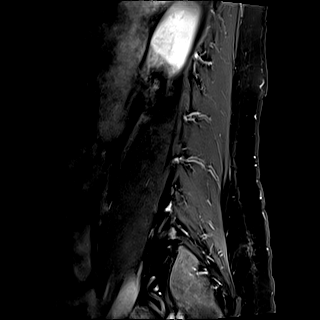

[32 of 48 positions shown; findings below may reference images not displayed]

FINDINGS: Segmentation:  Standard.

Alignment:  Physiologic.

Vertebrae:  No fracture, evidence of discitis, or bone lesion.

Conus medullaris and cauda equina: Conus extends to the L1-2 level.
Conus and cauda equina appear normal.

Paraspinal and other soft tissues: Expected postoperative scarring.

Disc levels:

L4-5 disc narrowing asymmetric to the right with endplate
degeneration and disc bulging. There has been resection of the
recurrent right subarticular herniation with no recurrence or
excessive granulation tissue. The foramina are patent

L5-S1 mild disc narrowing and desiccation with posterior annular
fissure. Minor facet spurring. No neural impingement
IMPRESSION: L4-5 interval right microdiscectomy without recurrent
herniation/impingement.

## 2020-01-23 MED ORDER — GADOBUTROL 1 MMOL/ML IV SOLN
7.5000 mL | Freq: Once | INTRAVENOUS | Status: AC | PRN
  Administered 2020-01-23: 7.5 mL via INTRAVENOUS

## 2022-05-01 DIAGNOSIS — O09522 Supervision of elderly multigravida, second trimester: Secondary | ICD-10-CM | POA: Insufficient documentation

## 2022-05-30 LAB — OB RESULTS CONSOLE HEPATITIS B SURFACE ANTIGEN: Hepatitis B Surface Ag: NEGATIVE

## 2022-06-09 ENCOUNTER — Other Ambulatory Visit: Payer: Self-pay | Admitting: Obstetrics and Gynecology

## 2022-06-09 DIAGNOSIS — O10019 Pre-existing essential hypertension complicating pregnancy, unspecified trimester: Secondary | ICD-10-CM

## 2022-06-09 DIAGNOSIS — O09521 Supervision of elderly multigravida, first trimester: Secondary | ICD-10-CM

## 2022-06-09 DIAGNOSIS — Z363 Encounter for antenatal screening for malformations: Secondary | ICD-10-CM

## 2022-06-09 DIAGNOSIS — O99210 Obesity complicating pregnancy, unspecified trimester: Secondary | ICD-10-CM

## 2022-06-23 ENCOUNTER — Ambulatory Visit: Payer: Self-pay

## 2022-06-23 ENCOUNTER — Other Ambulatory Visit: Payer: Self-pay

## 2022-07-01 ENCOUNTER — Encounter: Payer: Self-pay | Admitting: *Deleted

## 2022-07-08 ENCOUNTER — Ambulatory Visit: Payer: Managed Care, Other (non HMO) | Attending: Obstetrics and Gynecology

## 2022-07-08 ENCOUNTER — Ambulatory Visit: Payer: Managed Care, Other (non HMO) | Admitting: *Deleted

## 2022-07-08 VITALS — BP 132/87 | HR 93

## 2022-07-08 DIAGNOSIS — O09522 Supervision of elderly multigravida, second trimester: Secondary | ICD-10-CM | POA: Diagnosis present

## 2022-07-08 DIAGNOSIS — O09521 Supervision of elderly multigravida, first trimester: Secondary | ICD-10-CM

## 2022-07-08 DIAGNOSIS — O10019 Pre-existing essential hypertension complicating pregnancy, unspecified trimester: Secondary | ICD-10-CM | POA: Diagnosis present

## 2022-07-08 DIAGNOSIS — O99210 Obesity complicating pregnancy, unspecified trimester: Secondary | ICD-10-CM | POA: Diagnosis present

## 2022-07-08 DIAGNOSIS — Z363 Encounter for antenatal screening for malformations: Secondary | ICD-10-CM

## 2022-07-09 ENCOUNTER — Other Ambulatory Visit: Payer: Self-pay | Admitting: *Deleted

## 2022-07-09 DIAGNOSIS — O10912 Unspecified pre-existing hypertension complicating pregnancy, second trimester: Secondary | ICD-10-CM

## 2022-07-09 DIAGNOSIS — O09522 Supervision of elderly multigravida, second trimester: Secondary | ICD-10-CM

## 2022-07-21 ENCOUNTER — Encounter
Admission: RE | Admit: 2022-07-21 | Discharge: 2022-07-21 | Disposition: A | Discharge status: Home / Self Care | Payer: Managed Care, Other (non HMO) | Source: Ambulatory Visit | Attending: Anesthesiology | Admitting: Anesthesiology

## 2022-07-21 NOTE — Progress Notes (Signed)
[facility] Anesthesia Consultation  Sandy Figueroa OAC:[id number] DOB: 06-22-1981 DOA: 07/21/2022 PCP: Patient, No Pcp Per   Requesting physician: Dr. Feliberto Gottron Date of consultation: 07/21/2022 Reason for consultation: Hx of spine surgery  CHIEF COMPLAINT:  Pregnancy  HISTORY OF PRESENT ILLNESS: Sandy Figueroa  is a 41 y.o. female with a known history of hypertension and a history of microdiscectomy and hemilaminectomy who is presenting to talk about options for neuraxial anesthesia in her upcoming delivery.  PAST MEDICAL HISTORY:   Past Medical History:  Diagnosis Date   Depression    History of kidney stones     PAST SURGICAL HISTORY:  Past Surgical History:  Procedure Laterality Date   HEMI-MICRODISCECTOMY LUMBAR LAMINECTOMY LEVEL 1 Right 11/20/2019   Procedure: REDO RIGHT L4-5 HEMILAMINECTOMY AND DISCECTOMY;  Surgeon: Lucy Chris, MD;  Location: [facility] ORS;  Service: Neurosurgery;  Laterality: Right;   SPINE SURGERY     WISDOM TOOTH EXTRACTION      SOCIAL HISTORY:  Social History   Tobacco Use   Smoking status: Former    Types: Cigarettes    Quit date: 08/24/2006    Years since quitting: 15.9   Smokeless tobacco: Never  Substance Use Topics   Alcohol use: Not Currently    Comment: occ    FAMILY HISTORY:  Family History  Problem Relation Age of Onset   Stroke Father    Hyperlipidemia Father    Hypertension Father    Kidney disease Father    Heart disease Father    Diabetes Maternal Aunt    Diabetes Maternal Uncle    Breast cancer Neg Hx    Ovarian cancer Neg Hx    Asthma Neg Hx    Birth defects Neg Hx    Cancer Neg Hx     DRUG ALLERGIES:  Allergies  Allergen Reactions   Methylprednisolone Other (See Comments)    Vision loss Was able to tolerate prednisone    REVIEW OF SYSTEMS:   RESPIRATORY: No cough, shortness of breath, wheezing.  CARDIOVASCULAR: No chest pain, orthopnea, edema.  HEMATOLOGY: No anemia, easy  bruising or bleeding SKIN: No rash or lesion. NEUROLOGIC: No tingling, numbness, weakness.  PSYCHIATRY: No anxiety or depression.   MEDICATIONS AT HOME:  Prior to Admission medications   Medication Sig Start Date End Date Taking? Authorizing Provider  acetaminophen (TYLENOL) 500 MG tablet Take 500 mg by mouth every 6 (six) hours as needed.    [provider]  aspirin EC 81 MG tablet Take 81 mg by mouth daily. Swallow whole.    [provider]  oxyCODONE (ROXICODONE) 5 MG immediate release tablet Take 1 tablet (5 mg total) by mouth every 4 (four) hours as needed for severe pain. Patient not taking: Reported on 07/08/2022 11/20/19   Lucy Chris, MD      PHYSICAL EXAMINATION:   VITAL SIGNS: Last menstrual period 01/27/2022, unknown if currently breastfeeding.  GENERAL:  41 y.o.-year-old patient no acute distress.  HEENT: Head atraumatic, normocephalic. Oropharynx and nasopharynx clear. MP 1, TM distance >3 cm, normal mouth opening. LUNGS: Normal breath sounds bilaterally, no wheezing, rales,rhonchi. No use of accessory muscles of respiration.  CARDIOVASCULAR: S1, S2 normal. No murmurs, rubs, or gallops.  EXTREMITIES: No pedal edema, cyanosis, or clubbing.  NEUROLOGIC: normal gait PSYCHIATRIC: The patient is alert and oriented x 3.  SKIN: No obvious rash, lesion, or ulcer.    IMPRESSION AND PLAN:   Sandy Figueroa  is a 41 y.o. female presenting at [redacted] weeks  gestation with hx of spine surgery.   Pt may desire an epidural for labor analgesia. We discussed that there are some increased risks in the setting of prior back surgery. She does not have any hardware, so we did not talk about infection of existing hardware, but we did discuss infection as a risk of any neuraxial anesthetic. In prior spine surgery there can be scarring of the epidural space that increases the risk of accidental dural puncture or inadequate expansion of the epidural space resulting in inadequate  analgesia. It is also possible that we are unable to successfully place an epidural catheter. We did also briefly discuss other options for labor analgesia, including IV pain medications and self administered nitrous oxide.   We discussed that should a cesarean delivery be required and an epidural is in place we may be able to use the epidural to provide anesthesia. We also discussed spinal anesthesia as as a good option for her in the setting of emergent cesarean delivery.  Because I was not concerned that we would have difficulty with neuraxial placement we did not discuss the backup plan of general anesthesia at this time.  We will likely discuss that in the future when she comes in for delivery.

## 2022-08-11 ENCOUNTER — Other Ambulatory Visit: Payer: Self-pay

## 2022-08-11 DIAGNOSIS — O10919 Unspecified pre-existing hypertension complicating pregnancy, unspecified trimester: Secondary | ICD-10-CM

## 2022-08-11 DIAGNOSIS — O09523 Supervision of elderly multigravida, third trimester: Secondary | ICD-10-CM

## 2022-08-11 DIAGNOSIS — O99210 Obesity complicating pregnancy, unspecified trimester: Secondary | ICD-10-CM

## 2022-08-13 ENCOUNTER — Other Ambulatory Visit: Payer: Self-pay

## 2022-08-13 ENCOUNTER — Ambulatory Visit: Payer: Managed Care, Other (non HMO) | Attending: Obstetrics

## 2022-08-13 DIAGNOSIS — O09523 Supervision of elderly multigravida, third trimester: Secondary | ICD-10-CM | POA: Insufficient documentation

## 2022-08-13 DIAGNOSIS — O99213 Obesity complicating pregnancy, third trimester: Secondary | ICD-10-CM | POA: Diagnosis not present

## 2022-08-13 DIAGNOSIS — O10919 Unspecified pre-existing hypertension complicating pregnancy, unspecified trimester: Secondary | ICD-10-CM

## 2022-08-13 DIAGNOSIS — O10913 Unspecified pre-existing hypertension complicating pregnancy, third trimester: Secondary | ICD-10-CM | POA: Insufficient documentation

## 2022-08-13 DIAGNOSIS — O99210 Obesity complicating pregnancy, unspecified trimester: Secondary | ICD-10-CM

## 2022-08-13 DIAGNOSIS — E669 Obesity, unspecified: Secondary | ICD-10-CM

## 2022-08-13 DIAGNOSIS — O10013 Pre-existing essential hypertension complicating pregnancy, third trimester: Secondary | ICD-10-CM | POA: Diagnosis not present

## 2022-08-13 DIAGNOSIS — Z3A28 28 weeks gestation of pregnancy: Secondary | ICD-10-CM | POA: Insufficient documentation

## 2022-08-24 NOTE — L&D Delivery Note (Signed)
Delivery Note  First Stage: Labor onset: 1430 Augmentation: cytotec Analgesia /Anesthesia intrapartum: IV Fentanyl x 1 AROM at 1421  Second Stage: Complete dilation at 1917 Onset of pushing at 1917 FHR second stage 125bpm  Delivery of a viable female infant on 10/24/22 at [place] by CNM delivery of fetal head in ROA position with restitution to ROT. nuchal cord x 1, true knot noted;  Anterior then posterior shoulders delivered easily with gentle downward traction. Baby placed on mom's chest, and attended to by peds.  Cord double clamped after cessation of pulsation, cut by FOB Cord blood sample collected    Third Stage: Placenta delivered spontaneously intact with 3VC @ 1929 Placenta disposition: routine disposal Uterine tone Firm with massage / bleeding scant  No lacerations identified  Anesthesia for repair: n/a Est. Blood Loss (mL): [id number]  Complications: CHTN- multiple doses of IV Labetalol needed during labor induction.   Mom to postpartum.  Baby to Couplet care / Skin to Skin.  Newborn: Birth Weight: pending  Apgar Scores: 8/9 Feeding planned: breast

## 2022-09-03 ENCOUNTER — Ambulatory Visit: Payer: Managed Care, Other (non HMO)

## 2022-09-23 ENCOUNTER — Other Ambulatory Visit: Payer: Self-pay

## 2022-09-23 DIAGNOSIS — O99210 Obesity complicating pregnancy, unspecified trimester: Secondary | ICD-10-CM

## 2022-09-23 DIAGNOSIS — O10919 Unspecified pre-existing hypertension complicating pregnancy, unspecified trimester: Secondary | ICD-10-CM

## 2022-09-23 DIAGNOSIS — O09523 Supervision of elderly multigravida, third trimester: Secondary | ICD-10-CM

## 2022-09-24 ENCOUNTER — Ambulatory Visit: Payer: Managed Care, Other (non HMO) | Attending: Maternal & Fetal Medicine

## 2022-09-24 ENCOUNTER — Ambulatory Visit: Payer: Managed Care, Other (non HMO)

## 2022-09-24 ENCOUNTER — Observation Stay
Admission: RE | Admit: 2022-09-24 | Discharge: 2022-09-24 | Disposition: A | Discharge status: Home / Self Care | Payer: Managed Care, Other (non HMO) | Attending: Obstetrics | Admitting: Obstetrics

## 2022-09-24 ENCOUNTER — Other Ambulatory Visit: Payer: Self-pay

## 2022-09-24 ENCOUNTER — Encounter: Payer: Self-pay | Admitting: Obstetrics and Gynecology

## 2022-09-24 DIAGNOSIS — O99891 Other specified diseases and conditions complicating pregnancy: Secondary | ICD-10-CM

## 2022-09-24 DIAGNOSIS — O09523 Supervision of elderly multigravida, third trimester: Secondary | ICD-10-CM

## 2022-09-24 DIAGNOSIS — E669 Obesity, unspecified: Secondary | ICD-10-CM

## 2022-09-24 DIAGNOSIS — Z87891 Personal history of nicotine dependence: Secondary | ICD-10-CM | POA: Insufficient documentation

## 2022-09-24 DIAGNOSIS — O163 Unspecified maternal hypertension, third trimester: Secondary | ICD-10-CM | POA: Diagnosis present

## 2022-09-24 DIAGNOSIS — Z362 Encounter for other antenatal screening follow-up: Secondary | ICD-10-CM | POA: Diagnosis not present

## 2022-09-24 DIAGNOSIS — Z3A34 34 weeks gestation of pregnancy: Secondary | ICD-10-CM | POA: Diagnosis not present

## 2022-09-24 DIAGNOSIS — O99213 Obesity complicating pregnancy, third trimester: Secondary | ICD-10-CM | POA: Diagnosis not present

## 2022-09-24 DIAGNOSIS — O10013 Pre-existing essential hypertension complicating pregnancy, third trimester: Secondary | ICD-10-CM | POA: Diagnosis not present

## 2022-09-24 DIAGNOSIS — O99210 Obesity complicating pregnancy, unspecified trimester: Secondary | ICD-10-CM

## 2022-09-24 DIAGNOSIS — O10919 Unspecified pre-existing hypertension complicating pregnancy, unspecified trimester: Secondary | ICD-10-CM

## 2022-09-24 LAB — URINE DRUG SCREEN, QUALITATIVE (ARMC ONLY)
Amphetamines, Ur Screen: NOT DETECTED
Barbiturates, Ur Screen: NOT DETECTED
Benzodiazepine, Ur Scrn: NOT DETECTED
Cannabinoid 50 Ng, Ur ~~LOC~~: NOT DETECTED
Cocaine Metabolite,Ur ~~LOC~~: NOT DETECTED
MDMA (Ecstasy)Ur Screen: NOT DETECTED
Methadone Scn, Ur: NOT DETECTED
Opiate, Ur Screen: NOT DETECTED
Phencyclidine (PCP) Ur S: NOT DETECTED
Tricyclic, Ur Screen: NOT DETECTED

## 2022-09-24 LAB — PROTEIN / CREATININE RATIO, URINE
Creatinine, Urine: 151 mg/dL
Protein Creatinine Ratio: 0.18 mg/mg{Cre} — ABNORMAL HIGH (ref 0.00–0.15)
Total Protein, Urine: 27 mg/dL

## 2022-09-24 LAB — COMPREHENSIVE METABOLIC PANEL
ALT: 17 U/L (ref 0–44)
AST: 23 U/L (ref 15–41)
Albumin: 2.7 g/dL — ABNORMAL LOW (ref 3.5–5.0)
Alkaline Phosphatase: 128 U/L — ABNORMAL HIGH (ref 38–126)
Anion gap: 10 (ref 5–15)
BUN: 12 mg/dL (ref 6–20)
CO2: 23 mmol/L (ref 22–32)
Calcium: 9.2 mg/dL (ref 8.9–10.3)
Chloride: 101 mmol/L (ref 98–111)
Creatinine, Ser: 0.81 mg/dL (ref 0.44–1.00)
GFR, Estimated: >60 mL/min (ref >60)
Glucose, Bld: 95 mg/dL (ref 70–99)
Potassium: 3.8 mmol/L (ref 3.5–5.1)
Sodium: 134 mmol/L — ABNORMAL LOW (ref 135–145)
Total Bilirubin: 0.5 mg/dL (ref 0.3–1.2)
Total Protein: 6.9 g/dL (ref 6.5–8.1)

## 2022-09-24 LAB — CBC
HCT: 32.3 % — ABNORMAL LOW (ref 36.0–46.0)
Hemoglobin: 10.9 g/dL — ABNORMAL LOW (ref 12.0–15.0)
MCH: 28.9 pg (ref 26.0–34.0)
MCHC: 33.7 g/dL (ref 30.0–36.0)
MCV: 85.7 fL (ref 80.0–100.0)
Platelets: 264 10*3/uL (ref 150–400)
RBC: 3.77 MIL/uL — ABNORMAL LOW (ref 3.87–5.11)
RDW: 12.9 % (ref 11.5–15.5)
WBC: 9.7 10*3/uL (ref 4.0–10.5)
nRBC: 0 % (ref 0.0–0.2)

## 2022-09-24 MED ORDER — ACETAMINOPHEN 325 MG PO TABS: 650.0000 mg | ORAL_TABLET | ORAL | Status: DC | PRN

## 2022-09-24 MED ORDER — DOCUSATE SODIUM 100 MG PO CAPS: 100.0000 mg | ORAL_CAPSULE | Freq: Every day | ORAL | Status: DC

## 2022-09-24 MED ORDER — NIFEDIPINE ER OSMOTIC RELEASE 30 MG PO TB24: 30.0000 mg | ORAL_TABLET | Freq: Every day | ORAL | 11 refills | Status: DC

## 2022-09-24 MED ORDER — DOCUSATE SODIUM 100 MG PO CAPS: 100.0000 mg | ORAL_CAPSULE | Freq: Every day | ORAL | 0 refills | Status: AC

## 2022-09-24 MED ORDER — PRENATAL MULTIVITAMIN CH: 1.0000 | ORAL_TABLET | Freq: Every day | ORAL | Status: DC

## 2022-09-24 MED ORDER — ZOLPIDEM TARTRATE 5 MG PO TABS: 5.0000 mg | ORAL_TABLET | Freq: Every evening | ORAL | Status: DC | PRN

## 2022-09-24 MED ORDER — NIFEDIPINE ER OSMOTIC RELEASE 30 MG PO TB24
30.0000 mg | ORAL_TABLET | Freq: Every day | ORAL | Status: DC
  Administered 2022-09-24: 30 mg via ORAL
  Filled 2022-09-24: qty 1

## 2022-09-24 MED ORDER — CALCIUM CARBONATE ANTACID 500 MG PO CHEW: 2.0000 | CHEWABLE_TABLET | ORAL | Status: DC | PRN

## 2022-09-24 NOTE — Progress Notes (Signed)
Pt had an ultrasound appt today at [facility].  Elevated BP's while there.  [facility] to send pt to [facility] for BP monitoring and labs.  Dr. Gertie Exon gave report to Avelino Leeds, CNM over the phone.  RN to transport pt to OBS 3 via wheelchair.

## 2022-09-24 NOTE — Discharge Summary (Signed)
Sandy Figueroa is a 42 y.o. female. She is at [redacted]w[redacted]d gestation. Patient's last menstrual period was 01/27/2022. 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 no CTX, no VB.no LOF,  Active fetal movement.   Maternal Medical History:  Past Medical Hx:  has a past medical history of Depression and History of kidney stones.    Past Surgical Hx:  has a past surgical history that includes Spine surgery; Wisdom tooth extraction; and Hemi-microdiscectomy lumbar laminectomy level 1 (Right, 11/20/2019).   Allergies  Allergen Reactions   Methylprednisolone Other (See Comments)    Vision loss Was able to tolerate prednisone     Prior to Admission medications   Medication Sig Start Date End Date Taking? Authorizing Provider  acetaminophen (TYLENOL) 500 MG tablet Take 500 mg by mouth every 6 (six) hours as needed.   Yes [provider]  aspirin EC 81 MG tablet Take 81 mg by mouth daily. Swallow whole.   Yes [provider]  lansoprazole (PREVACID SOLUTAB) 30 MG disintegrating tablet Take 15 mg by mouth daily as needed.   Yes [provider]  NIFEdipine (PROCARDIA-XL/NIFEDICAL-XL) 30 MG 24 hr tablet Take 1 tablet (30 mg total) by mouth daily. 09/24/22 09/24/23 Yes Ed Blalock, CNM  Prenatal MV & Min w/FA-DHA (PRENATAL GUMMIES PO) Take 1 tablet by mouth daily.   Yes [provider]  docusate sodium (COLACE) 100 MG capsule Take 1 capsule (100 mg total) by mouth daily. 08/29/08   Ed Blalock, CNM    Social History: She  reports that she quit smoking about 16 years ago. Her  smoking use included cigarettes. She has never used smokeless tobacco. She reports that she does not currently use alcohol. She reports that she does not use drugs.  Family History: family history includes Diabetes in her maternal aunt and maternal uncle; Heart disease in her father; Hyperlipidemia in her father; Hypertension in her father; Kidney disease in her father; Stroke in her father. ,no history of gyn cancers  Review of Systems: A full review of systems was performed and negative except as noted in the HPI.    O:  BP (!) 141/95   Pulse 87   Temp 98.3 F (36.8 C) (Oral)   Resp 17   Ht 5\' 3"  (1.6 m)   Wt 93.7 kg   LMP 01/27/2022   BMI 36.58 kg/m  Results for orders placed or performed during the hospital encounter of 09/24/22 (from the past 48 hour(s))  Protein / creatinine ratio, urine   Collection Time: 09/24/22  3:39 PM  Result Value Ref Range   Creatinine, Urine 151 mg/dL   Total Protein, Urine 27 mg/dL   Protein Creatinine Ratio 0.18 (H) 0.00 - 0.15 mg/mg[Cre]  Urine Drug Screen, Qualitative ([facility] only)   Collection Time: 09/24/22  3:39 PM  Result Value Ref Range   Tricyclic, Ur Screen NONE DETECTED NONE DETECTED   Amphetamines, Ur Screen NONE DETECTED NONE DETECTED   MDMA (Ecstasy)Ur Screen NONE DETECTED NONE DETECTED   Cocaine Metabolite,Ur [place] NONE DETECTED NONE DETECTED   Opiate, Ur Screen NONE DETECTED NONE DETECTED   Phencyclidine (PCP) Ur S NONE DETECTED NONE  DETECTED   Cannabinoid 50 Ng, Ur [place] NONE DETECTED NONE DETECTED   Barbiturates, Ur Screen NONE DETECTED NONE DETECTED   Benzodiazepine, Ur Scrn NONE DETECTED NONE DETECTED   Methadone Scn, Ur NONE DETECTED NONE DETECTED  CBC   Collection Time: 09/24/22  4:03 PM  Result Value Ref Range   WBC 9.7 4.0 - 10.5 K/uL   RBC 3.77 (L) 3.87 - 5.11 MIL/uL   Hemoglobin 10.9 (L) 12.0 - 15.0 g/dL   HCT 32.3 (L) 36.0 - 46.0 %   MCV 85.7 80.0 - 100.0 fL   MCH 28.9 26.0 - 34.0 pg   MCHC 33.7 30.0 - 36.0 g/dL   RDW  12.9 11.5 - 15.5 %   Platelets 264 150 - 400 K/uL   nRBC 0.0 0.0 - 0.2 %  Comprehensive metabolic panel   Collection Time: 09/24/22  4:03 PM  Result Value Ref Range   Sodium 134 (L) 135 - 145 mmol/L   Potassium 3.8 3.5 - 5.1 mmol/L   Chloride 101 98 - 111 mmol/L   CO2 23 22 - 32 mmol/L   Glucose, Bld 95 70 - 99 mg/dL   BUN 12 6 - 20 mg/dL   Creatinine, Ser 0.81 0.44 - 1.00 mg/dL   Calcium 9.2 8.9 - 10.3 mg/dL   Total Protein 6.9 6.5 - 8.1 g/dL   Albumin 2.7 (L) 3.5 - 5.0 g/dL   AST 23 15 - 41 U/L   ALT 17 0 - 44 U/L   Alkaline Phosphatase 128 (H) 38 - 126 U/L   Total Bilirubin 0.5 0.3 - 1.2 mg/dL   GFR, Estimated >60 >60 mL/min   Anion gap 10 5 - 15     Constitutional: NAD, AAOx3  HE/ENT: extraocular movements grossly intact, moist mucous membranes CV: RRR PULM: nl respiratory effort, CTABL Abd: gravid, non-tender, non-distended, soft  Ext: Non-tender, Nonedmeatous Psych: mood appropriate, speech normal Pelvic : deferred SVE:     Fetal Monitor: Baseline: 135 bpm Variability: moderate Accels: Present Decels: none Toco: none  Category: I   Assessment: 42 y.o. [redacted]w[redacted]d here for antenatal surveillance during pregnancy.Patient has not been seen since initial prenatal appt at 17 weeks. At that appointment she had mild range BP's. She reports seeing MFM 3 times. She reports eating [place] food twice in the last day and states she averages about 20 hours of sleep a week due to insomnia. She feels that if she could get some good sleep her BP would go down.  Principle diagnosis:  The encounter diagnosis was Elevated blood pressure affecting pregnancy in third trimester, antepartum.   Plan: Labor: not present.  Fetal Wellbeing: Reassuring Cat 1 tracing. Reactive NST  CMP, PCR,, CBC, UDS  WNL D/c home stable, precautions reviewed, follow-up as scheduled on 11/28/54  ----- Avelino Leeds, CNM Certified Nurse Midwife [facility]

## 2022-09-24 NOTE — OB Triage Note (Signed)
Pt G3P2 at [redacted]w[redacted]d presents from MFM for high BP. Pt denies HA, vision changes, or epigastric pain. Pt denies bleeding, LOF, ctx. Reports +FM. BP cycling Q15.

## 2022-09-29 ENCOUNTER — Other Ambulatory Visit: Payer: Self-pay

## 2022-09-29 DIAGNOSIS — O0993 Supervision of high risk pregnancy, unspecified, third trimester: Secondary | ICD-10-CM

## 2022-09-29 DIAGNOSIS — F32A Depression, unspecified: Secondary | ICD-10-CM

## 2022-09-29 DIAGNOSIS — O10919 Unspecified pre-existing hypertension complicating pregnancy, unspecified trimester: Secondary | ICD-10-CM

## 2022-09-29 DIAGNOSIS — O09523 Supervision of elderly multigravida, third trimester: Secondary | ICD-10-CM

## 2022-09-29 DIAGNOSIS — O99210 Obesity complicating pregnancy, unspecified trimester: Secondary | ICD-10-CM

## 2022-10-01 ENCOUNTER — Other Ambulatory Visit: Payer: Self-pay

## 2022-10-01 ENCOUNTER — Ambulatory Visit: Payer: Managed Care, Other (non HMO) | Attending: Maternal & Fetal Medicine

## 2022-10-01 DIAGNOSIS — O10013 Pre-existing essential hypertension complicating pregnancy, third trimester: Secondary | ICD-10-CM | POA: Diagnosis not present

## 2022-10-01 DIAGNOSIS — Z3A35 35 weeks gestation of pregnancy: Secondary | ICD-10-CM | POA: Diagnosis not present

## 2022-10-01 DIAGNOSIS — E669 Obesity, unspecified: Secondary | ICD-10-CM | POA: Diagnosis not present

## 2022-10-01 DIAGNOSIS — Z362 Encounter for other antenatal screening follow-up: Secondary | ICD-10-CM | POA: Insufficient documentation

## 2022-10-01 DIAGNOSIS — O99213 Obesity complicating pregnancy, third trimester: Secondary | ICD-10-CM

## 2022-10-01 DIAGNOSIS — O0993 Supervision of high risk pregnancy, unspecified, third trimester: Secondary | ICD-10-CM

## 2022-10-01 DIAGNOSIS — F32A Depression, unspecified: Secondary | ICD-10-CM

## 2022-10-01 DIAGNOSIS — O09523 Supervision of elderly multigravida, third trimester: Secondary | ICD-10-CM | POA: Diagnosis not present

## 2022-10-01 DIAGNOSIS — O10919 Unspecified pre-existing hypertension complicating pregnancy, unspecified trimester: Secondary | ICD-10-CM

## 2022-10-01 DIAGNOSIS — O99210 Obesity complicating pregnancy, unspecified trimester: Secondary | ICD-10-CM

## 2022-10-07 ENCOUNTER — Other Ambulatory Visit: Payer: Self-pay | Admitting: Maternal & Fetal Medicine

## 2022-10-07 DIAGNOSIS — O163 Unspecified maternal hypertension, third trimester: Secondary | ICD-10-CM

## 2022-10-08 ENCOUNTER — Other Ambulatory Visit: Payer: Self-pay

## 2022-10-08 ENCOUNTER — Ambulatory Visit: Payer: Managed Care, Other (non HMO) | Attending: Obstetrics

## 2022-10-08 DIAGNOSIS — O99213 Obesity complicating pregnancy, third trimester: Secondary | ICD-10-CM | POA: Diagnosis not present

## 2022-10-08 DIAGNOSIS — O09523 Supervision of elderly multigravida, third trimester: Secondary | ICD-10-CM | POA: Diagnosis not present

## 2022-10-08 DIAGNOSIS — Z3A36 36 weeks gestation of pregnancy: Secondary | ICD-10-CM | POA: Diagnosis not present

## 2022-10-08 DIAGNOSIS — O163 Unspecified maternal hypertension, third trimester: Secondary | ICD-10-CM

## 2022-10-08 DIAGNOSIS — O10913 Unspecified pre-existing hypertension complicating pregnancy, third trimester: Secondary | ICD-10-CM | POA: Insufficient documentation

## 2022-10-08 DIAGNOSIS — E669 Obesity, unspecified: Secondary | ICD-10-CM

## 2022-10-08 DIAGNOSIS — O10013 Pre-existing essential hypertension complicating pregnancy, third trimester: Secondary | ICD-10-CM | POA: Diagnosis not present

## 2022-10-09 LAB — OB RESULTS CONSOLE GC/CHLAMYDIA
Chlamydia: NEGATIVE
Neisseria Gonorrhea: NEGATIVE

## 2022-10-09 LAB — OB RESULTS CONSOLE HIV ANTIBODY (ROUTINE TESTING): HIV: NONREACTIVE

## 2022-10-11 LAB — OB RESULTS CONSOLE GBS: GBS: NEGATIVE

## 2022-10-11 LAB — OB RESULTS CONSOLE RUBELLA ANTIBODY, IGM: Rubella: IMMUNE

## 2022-10-11 LAB — OB RESULTS CONSOLE RPR: RPR: NONREACTIVE

## 2022-10-11 LAB — OB RESULTS CONSOLE VARICELLA ZOSTER ANTIBODY, IGG: Varicella: IMMUNE

## 2022-10-13 ENCOUNTER — Other Ambulatory Visit: Payer: Managed Care, Other (non HMO)

## 2022-10-13 ENCOUNTER — Other Ambulatory Visit: Payer: Self-pay

## 2022-10-13 DIAGNOSIS — O10919 Unspecified pre-existing hypertension complicating pregnancy, unspecified trimester: Secondary | ICD-10-CM

## 2022-10-13 DIAGNOSIS — O99210 Obesity complicating pregnancy, unspecified trimester: Secondary | ICD-10-CM

## 2022-10-13 DIAGNOSIS — O09523 Supervision of elderly multigravida, third trimester: Secondary | ICD-10-CM

## 2022-10-13 DIAGNOSIS — O0993 Supervision of high risk pregnancy, unspecified, third trimester: Secondary | ICD-10-CM

## 2022-10-15 ENCOUNTER — Ambulatory Visit (HOSPITAL_BASED_OUTPATIENT_CLINIC_OR_DEPARTMENT_OTHER): Payer: Managed Care, Other (non HMO)

## 2022-10-15 ENCOUNTER — Other Ambulatory Visit: Payer: Self-pay

## 2022-10-15 ENCOUNTER — Ambulatory Visit: Payer: Managed Care, Other (non HMO) | Attending: Obstetrics

## 2022-10-15 ENCOUNTER — Other Ambulatory Visit: Payer: Managed Care, Other (non HMO)

## 2022-10-15 DIAGNOSIS — O10013 Pre-existing essential hypertension complicating pregnancy, third trimester: Secondary | ICD-10-CM

## 2022-10-15 DIAGNOSIS — O0993 Supervision of high risk pregnancy, unspecified, third trimester: Secondary | ICD-10-CM

## 2022-10-15 DIAGNOSIS — O09523 Supervision of elderly multigravida, third trimester: Secondary | ICD-10-CM | POA: Diagnosis not present

## 2022-10-15 DIAGNOSIS — O99213 Obesity complicating pregnancy, third trimester: Secondary | ICD-10-CM | POA: Insufficient documentation

## 2022-10-15 DIAGNOSIS — O10913 Unspecified pre-existing hypertension complicating pregnancy, third trimester: Secondary | ICD-10-CM | POA: Insufficient documentation

## 2022-10-15 DIAGNOSIS — Z3A37 37 weeks gestation of pregnancy: Secondary | ICD-10-CM | POA: Insufficient documentation

## 2022-10-15 DIAGNOSIS — O10919 Unspecified pre-existing hypertension complicating pregnancy, unspecified trimester: Secondary | ICD-10-CM

## 2022-10-15 DIAGNOSIS — E669 Obesity, unspecified: Secondary | ICD-10-CM

## 2022-10-15 DIAGNOSIS — O99210 Obesity complicating pregnancy, unspecified trimester: Secondary | ICD-10-CM

## 2022-10-15 NOTE — Procedures (Signed)
NST canceled per MD, BPP obtained with follow up AFI/US.

## 2022-10-20 ENCOUNTER — Other Ambulatory Visit: Payer: Self-pay

## 2022-10-20 DIAGNOSIS — O99210 Obesity complicating pregnancy, unspecified trimester: Secondary | ICD-10-CM

## 2022-10-20 DIAGNOSIS — O09523 Supervision of elderly multigravida, third trimester: Secondary | ICD-10-CM

## 2022-10-20 DIAGNOSIS — F32A Depression, unspecified: Secondary | ICD-10-CM

## 2022-10-20 DIAGNOSIS — O10919 Unspecified pre-existing hypertension complicating pregnancy, unspecified trimester: Secondary | ICD-10-CM

## 2022-10-20 DIAGNOSIS — O163 Unspecified maternal hypertension, third trimester: Secondary | ICD-10-CM

## 2022-10-22 ENCOUNTER — Inpatient Hospital Stay: Payer: Managed Care, Other (non HMO) | Admitting: Obstetrics

## 2022-10-22 ENCOUNTER — Encounter: Payer: Self-pay | Admitting: Obstetrics and Gynecology

## 2022-10-22 ENCOUNTER — Observation Stay
Admission: RE | Admit: 2022-10-22 | Discharge: 2022-10-22 | Disposition: A | Discharge status: Home / Self Care | Payer: Managed Care, Other (non HMO) | Source: Home / Self Care

## 2022-10-22 ENCOUNTER — Other Ambulatory Visit: Payer: Self-pay

## 2022-10-22 ENCOUNTER — Ambulatory Visit (HOSPITAL_BASED_OUTPATIENT_CLINIC_OR_DEPARTMENT_OTHER): Payer: Managed Care, Other (non HMO)

## 2022-10-22 DIAGNOSIS — Z3A38 38 weeks gestation of pregnancy: Secondary | ICD-10-CM | POA: Insufficient documentation

## 2022-10-22 DIAGNOSIS — O10919 Unspecified pre-existing hypertension complicating pregnancy, unspecified trimester: Secondary | ICD-10-CM | POA: Diagnosis present

## 2022-10-22 DIAGNOSIS — O10913 Unspecified pre-existing hypertension complicating pregnancy, third trimester: Secondary | ICD-10-CM | POA: Insufficient documentation

## 2022-10-22 DIAGNOSIS — F32A Depression, unspecified: Secondary | ICD-10-CM

## 2022-10-22 DIAGNOSIS — O99210 Obesity complicating pregnancy, unspecified trimester: Secondary | ICD-10-CM

## 2022-10-22 DIAGNOSIS — O09523 Supervision of elderly multigravida, third trimester: Secondary | ICD-10-CM

## 2022-10-22 DIAGNOSIS — O163 Unspecified maternal hypertension, third trimester: Secondary | ICD-10-CM

## 2022-10-22 DIAGNOSIS — O471 False labor at or after 37 completed weeks of gestation: Secondary | ICD-10-CM | POA: Insufficient documentation

## 2022-10-22 DIAGNOSIS — Z7982 Long term (current) use of aspirin: Secondary | ICD-10-CM | POA: Insufficient documentation

## 2022-10-22 DIAGNOSIS — Z87891 Personal history of nicotine dependence: Secondary | ICD-10-CM | POA: Insufficient documentation

## 2022-10-22 DIAGNOSIS — J020 Streptococcal pharyngitis: Secondary | ICD-10-CM | POA: Insufficient documentation

## 2022-10-22 DIAGNOSIS — E669 Obesity, unspecified: Secondary | ICD-10-CM

## 2022-10-22 DIAGNOSIS — Z79899 Other long term (current) drug therapy: Secondary | ICD-10-CM | POA: Insufficient documentation

## 2022-10-22 DIAGNOSIS — O99213 Obesity complicating pregnancy, third trimester: Secondary | ICD-10-CM | POA: Insufficient documentation

## 2022-10-22 DIAGNOSIS — O99513 Diseases of the respiratory system complicating pregnancy, third trimester: Secondary | ICD-10-CM | POA: Insufficient documentation

## 2022-10-22 DIAGNOSIS — O10013 Pre-existing essential hypertension complicating pregnancy, third trimester: Secondary | ICD-10-CM

## 2022-10-22 LAB — PROTEIN / CREATININE RATIO, URINE
Creatinine, Urine: 80 mg/dL
Protein Creatinine Ratio: 0.19 mg/mg{Cre} — ABNORMAL HIGH (ref 0.00–0.15)
Total Protein, Urine: 15 mg/dL

## 2022-10-22 LAB — CBC
HCT: 35.3 % — ABNORMAL LOW (ref 36.0–46.0)
Hemoglobin: 11.9 g/dL — ABNORMAL LOW (ref 12.0–15.0)
MCH: 28.7 pg (ref 26.0–34.0)
MCHC: 33.7 g/dL (ref 30.0–36.0)
MCV: 85.1 fL (ref 80.0–100.0)
Platelets: 246 10*3/uL (ref 150–400)
RBC: 4.15 MIL/uL (ref 3.87–5.11)
RDW: 13.3 % (ref 11.5–15.5)
WBC: 8 10*3/uL (ref 4.0–10.5)
nRBC: 0 % (ref 0.0–0.2)

## 2022-10-22 LAB — COMPREHENSIVE METABOLIC PANEL
ALT: 16 U/L (ref 0–44)
AST: 22 U/L (ref 15–41)
Albumin: 2.9 g/dL — ABNORMAL LOW (ref 3.5–5.0)
Alkaline Phosphatase: 149 U/L — ABNORMAL HIGH (ref 38–126)
Anion gap: 12 (ref 5–15)
BUN: 14 mg/dL (ref 6–20)
CO2: 20 mmol/L — ABNORMAL LOW (ref 22–32)
Calcium: 9.6 mg/dL (ref 8.9–10.3)
Chloride: 104 mmol/L (ref 98–111)
Creatinine, Ser: 0.79 mg/dL (ref 0.44–1.00)
GFR, Estimated: >60 mL/min (ref >60)
Glucose, Bld: 83 mg/dL (ref 70–99)
Potassium: 4 mmol/L (ref 3.5–5.1)
Sodium: 136 mmol/L (ref 135–145)
Total Bilirubin: 0.4 mg/dL (ref 0.3–1.2)
Total Protein: 7 g/dL (ref 6.5–8.1)

## 2022-10-22 LAB — GROUP A STREP BY PCR: Group A Strep by PCR: DETECTED — AB

## 2022-10-22 MED ORDER — CALCIUM CARBONATE ANTACID 500 MG PO CHEW: 2.0000 | CHEWABLE_TABLET | ORAL | Status: DC | PRN

## 2022-10-22 MED ORDER — LABETALOL HCL 5 MG/ML IV SOLN: 40.0000 mg | INTRAVENOUS | Status: DC | PRN

## 2022-10-22 MED ORDER — ACETAMINOPHEN 500 MG PO TABS: 1000.0000 mg | ORAL_TABLET | Freq: Four times a day (QID) | ORAL | Status: DC | PRN

## 2022-10-22 MED ORDER — LABETALOL HCL 5 MG/ML IV SOLN: 80.0000 mg | INTRAVENOUS | Status: DC | PRN

## 2022-10-22 MED ORDER — HYDRALAZINE HCL 20 MG/ML IJ SOLN: 10.0000 mg | INTRAMUSCULAR | Status: DC | PRN

## 2022-10-22 MED ORDER — AMOXICILLIN 500 MG PO CAPS: 500.0000 mg | ORAL_CAPSULE | Freq: Two times a day (BID) | ORAL | 0 refills | Status: AC

## 2022-10-22 MED ORDER — NIFEDIPINE ER OSMOTIC RELEASE 30 MG PO TB24: 60.0000 mg | ORAL_TABLET | Freq: Every day | ORAL | Status: DC

## 2022-10-22 MED ORDER — LABETALOL HCL 5 MG/ML IV SOLN: 20.0000 mg | INTRAVENOUS | Status: DC | PRN

## 2022-10-22 MED ORDER — LABETALOL HCL 5 MG/ML IV SOLN
INTRAVENOUS | Status: AC
  Filled 2022-10-22: qty 4

## 2022-10-22 MED ORDER — LABETALOL HCL 100 MG PO TABS
200.0000 mg | ORAL_TABLET | Freq: Two times a day (BID) | ORAL | Status: DC
  Administered 2022-10-22: 200 mg via ORAL
  Filled 2022-10-22: qty 2

## 2022-10-22 MED ORDER — NIFEDIPINE 10 MG PO CAPS: 10.0000 mg | ORAL_CAPSULE | Freq: Once | ORAL | Status: AC

## 2022-10-22 MED ORDER — NIFEDIPINE 10 MG PO CAPS
ORAL_CAPSULE | ORAL | Status: AC
  Administered 2022-10-22: 10 mg via ORAL
  Filled 2022-10-22: qty 1

## 2022-10-22 MED ORDER — LABETALOL HCL 200 MG PO TABS: 200.0000 mg | ORAL_TABLET | Freq: Two times a day (BID) | ORAL | 1 refills | Status: DC

## 2022-10-22 NOTE — Progress Notes (Signed)
Pt presents to L/D triage with elevated blood pressures from the office. Pt reports no PIH symptoms at this time. +2 reflexes, no clonus, atypical edema, vision changes, or headache. Positive fetal movement with no bleeding or LOF.   Monitors applied and assessing.  Pt CHTN on daily procardia-taken this morning. Initial BP 157/110. Cycling q15 min.  Labs pending.

## 2022-10-22 NOTE — Progress Notes (Signed)
Pt's BP's elevated while at ultrasound appointment today in MFM clinic. Dr. Annamaria Boots requested pt be sent to [facility] for further monitoring of vital signs, labs and strep throat testing. Pt transported to [facility] via wheelchair and report given to Diona Fanti for pt  POC.

## 2022-10-22 NOTE — Discharge Instructions (Signed)
Induction of labor for [facility] at [facility]   Induction of labor scheduled for 10/24/2022 at 8:00 am    You have been scheduled for induction of labor on 10/24/2022 at 8:00 am.  Please call Labor and Delivery at [phone] an hour before your induction time (3/2 at 7AM) to make sure that they have bed availability for you.  Please be patient during this time as we cannot control how many babies want to have a birthday at the same time.      This information has been sent to your prenatal provider.  They can go over common induction methods and help answer questions.  You can also call L&D with questions prior to your induction.     Thank you,   Drinda Butts, [facility]

## 2022-10-22 NOTE — Discharge Summary (Signed)
Sandy Figueroa is a 42 y.o. female. She is at 51w2dgestation. Patient's last menstrual period was 01/27/2022. 11/03/2022, by Last Menstrual Period   [facility]  Chief complaint: sent from MFM d/t severe range blood pressures   HPI: [name] to L&D with complaints of elevated blood pressures.  She was seen for her scheduled prenatal appointment with MFM today.  Blood pressures were 141/110 -> 171/112.  She is currently taking Procardia '60mg'$  daily for chronic HTN in pregnancy.  She denies HA, changes in vision, or RUQ pain.  Denies contractions, LOF, or vaginal bleeding.  Endorses good fetal movement.  BPP today at MFM was 8/8 and AFI was 8 cm. LSyriannais scheduled for an IOL on 10/24/2022.  She strongly desires to wait for the induction d/t personal stressors at home.  Saturday would be better for her to be admitted to the hospital.   Factors complicating pregnancy: Chronic HTN in pregnancy  Lapse in prenatal care  AMA Obesity in pregnancy  Varicella non-immune   S: Resting comfortably. no CTX, no VB.no LOF,  Active fetal movement.   Maternal Medical History:  Past Medical Hx:  has a past medical history of Depression and History of kidney stones.    Past Surgical Hx:  has a past surgical history that includes Spine surgery; Wisdom tooth extraction; and Hemi-microdiscectomy lumbar laminectomy level 1 (Right, 11/20/2019).   Allergies  Allergen Reactions   Methylprednisolone Other (See Comments)    Vision loss Was able to tolerate prednisone     Prior to Admission medications   Medication Sig Start Date End Date Taking? Authorizing Provider  acetaminophen (TYLENOL) 500 MG tablet Take 500 mg by mouth every 6 (six) hours as needed.   Yes [provider]  amoxicillin (AMOXIL) 500 MG capsule Take 1 capsule (500 mg total) by mouth 2 (two) times daily for 10 days. 10/22/22 11/01/22 Yes MMinda Meo CNM  aspirin EC 81 MG tablet Take 81 mg by mouth  daily. Swallow whole.   Yes [provider]  calcium carbonate (TUMS - DOSED IN MG ELEMENTAL CALCIUM) 500 MG chewable tablet Chew 1 tablet by mouth daily.   Yes [provider]  docusate sodium (COLACE) 100 MG capsule Take 1 capsule (100 mg total) by mouth daily. 09/24/22  Yes DAvelino LeedsLArlyn Leak CNM  famotidine (PEPCID) 20 MG tablet Take 20 mg by mouth daily.   Yes [provider]  Prenatal MV & Min w/FA-DHA (PRENATAL GUMMIES PO) Take 1 tablet by mouth daily.   Yes [provider]  labetalol (NORMODYNE) 200 MG tablet Take 1 tablet (200 mg total) by mouth 2 (two) times daily. 10/23/22   MMinda Meo CNM  lansoprazole (PREVACID SOLUTAB) 30 MG disintegrating tablet Take 15 mg by mouth daily as needed. Patient not taking: Reported on 10/08/2022    [provider]  NIFEdipine (PROCARDIA-XL/NIFEDICAL-XL) 30 MG 24 hr tablet Take 2 tablets (60 mg total) by mouth daily. Changed to 60 mg 10/06/22 10/22/22 10/22/23  MMinda Meo CNM    Social History: She  reports that she quit smoking about 16 years ago. Her smoking use included cigarettes. She has never used smokeless tobacco. She reports that she does not currently use alcohol. She reports that she does not use drugs.  Family History: family history includes Diabetes in her maternal aunt and maternal uncle; Heart disease in her father; Hyperlipidemia in her father; Hypertension in her father; Kidney disease in  her father; Stroke in her father.   Review of Systems: A full review of systems was performed and negative except as noted in the HPI.     Pertinent Results:    O:  BP 124/81   Pulse 79   Temp 99 F (37.2 C) (Oral)   Resp 18   Ht '5\' 3"'$  (1.6 m)   Wt 93 kg   LMP 01/27/2022   BMI 36.31 kg/m  Results for orders placed or performed during the hospital encounter of 10/22/22 (from the past 48 hour(s))  Group A Strep by PCR   Collection Time: 10/22/22  3:52 PM   Specimen: Throat;  Sterile Swab  Result Value Ref Range   Group A Strep by PCR DETECTED (A) NOT DETECTED  Protein / creatinine ratio, urine   Collection Time: 10/22/22  3:52 PM  Result Value Ref Range   Creatinine, Urine 80 mg/dL   Total Protein, Urine 15 mg/dL   Protein Creatinine Ratio 0.19 (H) 0.00 - 0.15 mg/mg[Cre]  Comprehensive metabolic panel   Collection Time: 10/22/22  4:23 PM  Result Value Ref Range   Sodium 136 135 - 145 mmol/L   Potassium 4.0 3.5 - 5.1 mmol/L   Chloride 104 98 - 111 mmol/L   CO2 20 (L) 22 - 32 mmol/L   Glucose, Bld 83 70 - 99 mg/dL   BUN 14 6 - 20 mg/dL   Creatinine, Ser 0.79 0.44 - 1.00 mg/dL   Calcium 9.6 8.9 - 10.3 mg/dL   Total Protein 7.0 6.5 - 8.1 g/dL   Albumin 2.9 (L) 3.5 - 5.0 g/dL   AST 22 15 - 41 U/L   ALT 16 0 - 44 U/L   Alkaline Phosphatase 149 (H) 38 - 126 U/L   Total Bilirubin 0.4 0.3 - 1.2 mg/dL   GFR, Estimated >60 >60 mL/min   Anion gap 12 5 - 15  CBC   Collection Time: 10/22/22  4:23 PM  Result Value Ref Range   WBC 8.0 4.0 - 10.5 K/uL   RBC 4.15 3.87 - 5.11 MIL/uL   Hemoglobin 11.9 (L) 12.0 - 15.0 g/dL   HCT 35.3 (L) 36.0 - 46.0 %   MCV 85.1 80.0 - 100.0 fL   MCH 28.7 26.0 - 34.0 pg   MCHC 33.7 30.0 - 36.0 g/dL   RDW 13.3 11.5 - 15.5 %   Platelets 246 150 - 400 K/uL   nRBC 0.0 0.0 - 0.2 %     Constitutional: NAD, AAOx3  HE/ENT: extraocular movements grossly intact, moist mucous membranes CV: RRR PULM: nl respiratory effort Abd: gravid, non-tender, non-distended, soft  Ext: Non-tender, Nonedmeatous Psych: mood appropriate, speech normal Pelvic : deferred   NST: Baseline FHR: 130 beats/min Variability: moderate Accelerations: present Decelerations: absent Tocometry: Irregular, mild contractions   Interpretation:  INDICATIONS: chronic hypertension RESULTS:  A NST procedure was performed with FHR monitoring and a normal baseline established, appropriate time of 20-40 minutes of evaluation, and accels >2 seen w 15x15  characteristics.  Results show a REACTIVE NST.   Assessment: 42 y.o. [id number] 84w2d3/07/2023, by Last Menstrual Period   Principle diagnosis: Chronic hypertension during pregnancy [O10.919]   Plan: 1) Reactive NST  -Category 1 tracing  -Reassuring fetal status   2) Chronic Hypertension in pregnancy  -Preeclampsia labs WNL  -Several severe range blood pressures noted on arrival to L&D.  Unable to quickly obtain IV access and PO Nifedipine was use to treat blood pressures.   -Started on Labetolol  200 mg PO - first dose given in OB triage  -Blood pressure improved with PO labetalol  -Brya and I had a thoughtful conversation about delivery recommendations, her blood pressures, and risk factors for pregnancy.  Chronic hypertension and severe range blood pressures can significantly increase the risk of FGR, oligohydramnios, placental abruption, seizures, stroke, and IUFD.  She strongly desires to wait for Saturday to start an induction.  She understands the reason and indication for delivery but also points out that antepartum screening is reassuring and blood pressures improved with oral antihypertensives.  Lymari will continue with her Procardia daily and add in Labetalol 200 mg daily.  Plan to return Saturday for IOL.  She will come to L&D sooner for uncontrolled BP's or decreased fetal movement.   -Discussed warning signs to return to L&D triage with   3) Group A strep -Positive for Group A strep -Rx for amoxicillin sent to pharmacy   4) Disposition: discharge home stable -Dr. Ouida Sills updated on plan of care, medications, and returning for IOL on Saturday  -Precautions reviewed  -Follow up this Saturday at 8 am for scheduled IOL    Allergies as of 10/22/2022       Reactions   Methylprednisolone Other (See Comments)   Vision loss Was able to tolerate prednisone        Medication List     STOP taking these medications    lansoprazole 30 MG disintegrating tablet Commonly  known as: PREVACID SOLUTAB       TAKE these medications    acetaminophen 500 MG tablet Commonly known as: TYLENOL Take 500 mg by mouth every 6 (six) hours as needed.   amoxicillin 500 MG capsule Commonly known as: AMOXIL Take 1 capsule (500 mg total) by mouth 2 (two) times daily for 10 days.   aspirin EC 81 MG tablet Take 81 mg by mouth daily. Swallow whole.   calcium carbonate 500 MG chewable tablet Commonly known as: TUMS - dosed in mg elemental calcium Chew 1 tablet by mouth daily.   docusate sodium 100 MG capsule Commonly known as: COLACE Take 1 capsule (100 mg total) by mouth daily.   famotidine 20 MG tablet Commonly known as: PEPCID Take 20 mg by mouth daily.   labetalol 200 MG tablet Commonly known as: NORMODYNE Take 1 tablet (200 mg total) by mouth 2 (two) times daily. Start taking on: October 23, 2022   NIFEdipine 30 MG 24 hr tablet Commonly known as: PROCARDIA-XL/NIFEDICAL-XL Take 2 tablets (60 mg total) by mouth daily. Changed to 60 mg 10/06/22   PRENATAL GUMMIES PO Take 1 tablet by mouth daily.         ----- Drinda Butts, CNM Certified Nurse Midwife [facility]

## 2022-10-22 NOTE — Progress Notes (Signed)
MFM Note  Sandy Figueroa was seen for a BPP and growth scan due to chronic hypertension treated with nifedipine XL 60 mg daily.  She is currently at 38 weeks and 2 days.  The patient reports that she took nifedipine this morning.  Her blood pressure today were 141/110 and 171/112.  The patient reports that her blood pressures are usually elevated when she comes to our office.    She denies any signs or symptoms of preeclampsia.    The patient showed me a log of her blood pressures at home which were mostly in the 140s to 150s over 90s range.  On today's exam, the overall EFW of 8 pounds 9 ounces measures at the 92nd percentile for gestational age.  There was normal amniotic fluid noted with a total AFI of 8.62 cm.  The fetus is in the vertex presentation.  A biophysical profile performed today was 8/8.    Due to her extremely elevated blood pressures, the patient was sent to L&D at [facility] following today's ultrasound exam to be worked up for preeclampsia and for serial blood pressure checks.    Should her blood pressures remain in the severe range or should she be diagnosed with preeclampsia, delivery is recommended today.  The patient had hoped to delay delivery for as long as possible.  She is already scheduled for induction in 2 days.  The patient stated that all of her questions were answered today.  A total of 20 minutes was spent counseling and coordinating the care for this patient.  Greater than 50% of the time was spent in direct face-to-face contact.

## 2022-10-24 ENCOUNTER — Other Ambulatory Visit: Payer: Self-pay

## 2022-10-24 ENCOUNTER — Inpatient Hospital Stay
Admission: EM | Admit: 2022-10-24 | Discharge: 2022-10-26 | DRG: 807 | Disposition: A | Discharge status: Home / Self Care | Payer: Managed Care, Other (non HMO) | Attending: Obstetrics and Gynecology | Admitting: Obstetrics and Gynecology

## 2022-10-24 ENCOUNTER — Encounter: Payer: Self-pay | Admitting: Obstetrics and Gynecology

## 2022-10-24 DIAGNOSIS — O1002 Pre-existing essential hypertension complicating childbirth: Principal | ICD-10-CM | POA: Diagnosis present

## 2022-10-24 DIAGNOSIS — Z3A38 38 weeks gestation of pregnancy: Secondary | ICD-10-CM | POA: Diagnosis not present

## 2022-10-24 DIAGNOSIS — Z87891 Personal history of nicotine dependence: Secondary | ICD-10-CM | POA: Diagnosis not present

## 2022-10-24 DIAGNOSIS — Z87442 Personal history of urinary calculi: Secondary | ICD-10-CM | POA: Diagnosis not present

## 2022-10-24 DIAGNOSIS — O99214 Obesity complicating childbirth: Secondary | ICD-10-CM | POA: Diagnosis present

## 2022-10-24 DIAGNOSIS — O10919 Unspecified pre-existing hypertension complicating pregnancy, unspecified trimester: Principal | ICD-10-CM | POA: Diagnosis present

## 2022-10-24 LAB — COMPREHENSIVE METABOLIC PANEL
ALT: 16 U/L (ref 0–44)
AST: 34 U/L (ref 15–41)
Albumin: 2.8 g/dL — ABNORMAL LOW (ref 3.5–5.0)
Alkaline Phosphatase: 138 U/L — ABNORMAL HIGH (ref 38–126)
Anion gap: 11 (ref 5–15)
BUN: 18 mg/dL (ref 6–20)
CO2: 19 mmol/L — ABNORMAL LOW (ref 22–32)
Calcium: 9.9 mg/dL (ref 8.9–10.3)
Chloride: 104 mmol/L (ref 98–111)
Creatinine, Ser: 0.8 mg/dL (ref 0.44–1.00)
GFR, Estimated: >60 mL/min (ref >60)
Glucose, Bld: 134 mg/dL — ABNORMAL HIGH (ref 70–99)
Potassium: 3.5 mmol/L (ref 3.5–5.1)
Sodium: 134 mmol/L — ABNORMAL LOW (ref 135–145)
Total Bilirubin: 0.3 mg/dL (ref 0.3–1.2)
Total Protein: 6.9 g/dL (ref 6.5–8.1)

## 2022-10-24 LAB — PROTEIN / CREATININE RATIO, URINE
Creatinine, Urine: 104 mg/dL
Protein Creatinine Ratio: 0.11 mg/mg{Cre} (ref 0.00–0.15)
Total Protein, Urine: 11 mg/dL

## 2022-10-24 LAB — CBC
HCT: 32.6 % — ABNORMAL LOW (ref 36.0–46.0)
Hemoglobin: 11 g/dL — ABNORMAL LOW (ref 12.0–15.0)
MCH: 28.9 pg (ref 26.0–34.0)
MCHC: 33.7 g/dL (ref 30.0–36.0)
MCV: 85.6 fL (ref 80.0–100.0)
Platelets: 214 10*3/uL (ref 150–400)
RBC: 3.81 MIL/uL — ABNORMAL LOW (ref 3.87–5.11)
RDW: 13.5 % (ref 11.5–15.5)
WBC: 7.6 10*3/uL (ref 4.0–10.5)
nRBC: 0 % (ref 0.0–0.2)

## 2022-10-24 LAB — TYPE AND SCREEN
ABO/RH(D): O POS
Antibody Screen: NEGATIVE

## 2022-10-24 MED ORDER — DIPHENHYDRAMINE HCL 25 MG PO CAPS: 25.0000 mg | ORAL_CAPSULE | Freq: Four times a day (QID) | ORAL | Status: DC | PRN

## 2022-10-24 MED ORDER — FAMOTIDINE 20 MG PO TABS
20.0000 mg | ORAL_TABLET | Freq: Every day | ORAL | Status: DC
  Filled 2022-10-24 (×2): qty 1

## 2022-10-24 MED ORDER — ONDANSETRON HCL 4 MG PO TABS: 4.0000 mg | ORAL_TABLET | ORAL | Status: DC | PRN

## 2022-10-24 MED ORDER — OXYTOCIN-SODIUM CHLORIDE 30-0.9 UT/500ML-% IV SOLN
1.0000 m[IU]/min | INTRAVENOUS | Status: DC
  Filled 2022-10-24: qty 500

## 2022-10-24 MED ORDER — LACTATED RINGERS IV SOLN: 500.0000 mL | INTRAVENOUS | Status: DC | PRN

## 2022-10-24 MED ORDER — ZOLPIDEM TARTRATE 5 MG PO TABS: 5.0000 mg | ORAL_TABLET | Freq: Every evening | ORAL | Status: DC | PRN

## 2022-10-24 MED ORDER — LIDOCAINE HCL (PF) 1 % IJ SOLN
INTRAMUSCULAR | Status: AC
  Filled 2022-10-24: qty 30

## 2022-10-24 MED ORDER — ACETAMINOPHEN 325 MG PO TABS
650.0000 mg | ORAL_TABLET | ORAL | Status: DC | PRN
  Administered 2022-10-24: 650 mg via ORAL
  Filled 2022-10-24 (×3): qty 2

## 2022-10-24 MED ORDER — LABETALOL HCL 5 MG/ML IV SOLN
40.0000 mg | INTRAVENOUS | Status: DC | PRN
  Administered 2022-10-24: 40 mg via INTRAVENOUS
  Filled 2022-10-24: qty 8

## 2022-10-24 MED ORDER — AMMONIA AROMATIC IN INHA
RESPIRATORY_TRACT | Status: AC
  Filled 2022-10-24: qty 10

## 2022-10-24 MED ORDER — MISOPROSTOL 200 MCG PO TABS
ORAL_TABLET | ORAL | Status: AC
  Filled 2022-10-24: qty 4

## 2022-10-24 MED ORDER — LACTATED RINGERS IV SOLN: INTRAVENOUS | Status: DC

## 2022-10-24 MED ORDER — DIBUCAINE (PERIANAL) 1 % EX OINT
1.0000 | TOPICAL_OINTMENT | CUTANEOUS | Status: DC | PRN
  Administered 2022-10-24: 1 via RECTAL
  Filled 2022-10-24: qty 28

## 2022-10-24 MED ORDER — ONDANSETRON HCL 4 MG/2ML IJ SOLN: 4.0000 mg | Freq: Four times a day (QID) | INTRAMUSCULAR | Status: DC | PRN

## 2022-10-24 MED ORDER — BENZOCAINE-MENTHOL 20-0.5 % EX AERO
1.0000 | INHALATION_SPRAY | CUTANEOUS | Status: DC | PRN
  Administered 2022-10-24: 1 via TOPICAL
  Filled 2022-10-24: qty 56

## 2022-10-24 MED ORDER — PRENATAL MULTIVITAMIN CH: 1.0000 | ORAL_TABLET | Freq: Every day | ORAL | Status: DC

## 2022-10-24 MED ORDER — COCONUT OIL OIL
1.0000 | TOPICAL_OIL | Status: DC | PRN
  Filled 2022-10-24: qty 7.5

## 2022-10-24 MED ORDER — AMOXICILLIN 500 MG PO CAPS
500.0000 mg | ORAL_CAPSULE | Freq: Two times a day (BID) | ORAL | Status: DC
  Administered 2022-10-24 – 2022-10-26 (×4): 500 mg via ORAL
  Filled 2022-10-24 (×5): qty 1

## 2022-10-24 MED ORDER — FENTANYL CITRATE (PF) 100 MCG/2ML IJ SOLN
50.0000 ug | INTRAMUSCULAR | Status: DC | PRN
  Administered 2022-10-24: 50 ug via INTRAVENOUS
  Filled 2022-10-24: qty 2

## 2022-10-24 MED ORDER — OXYTOCIN-SODIUM CHLORIDE 30-0.9 UT/500ML-% IV SOLN: 2.5000 [IU]/h | INTRAVENOUS | Status: DC

## 2022-10-24 MED ORDER — ONDANSETRON HCL 4 MG/2ML IJ SOLN: 4.0000 mg | INTRAMUSCULAR | Status: DC | PRN

## 2022-10-24 MED ORDER — HYDRALAZINE HCL 20 MG/ML IJ SOLN: 10.0000 mg | INTRAMUSCULAR | Status: DC | PRN

## 2022-10-24 MED ORDER — NIFEDIPINE ER OSMOTIC RELEASE 30 MG PO TB24
90.0000 mg | ORAL_TABLET | Freq: Every day | ORAL | Status: DC
  Administered 2022-10-25 – 2022-10-26 (×2): 90 mg via ORAL
  Filled 2022-10-24 (×2): qty 3

## 2022-10-24 MED ORDER — LABETALOL HCL 5 MG/ML IV SOLN
80.0000 mg | INTRAVENOUS | Status: DC | PRN
  Administered 2022-10-24: 80 mg via INTRAVENOUS
  Filled 2022-10-24: qty 16

## 2022-10-24 MED ORDER — LABETALOL HCL 200 MG PO TABS
200.0000 mg | ORAL_TABLET | Freq: Two times a day (BID) | ORAL | Status: DC
  Filled 2022-10-24 (×2): qty 1

## 2022-10-24 MED ORDER — OXYTOCIN 10 UNIT/ML IJ SOLN
INTRAMUSCULAR | Status: AC
  Filled 2022-10-24: qty 2

## 2022-10-24 MED ORDER — LABETALOL HCL 5 MG/ML IV SOLN
20.0000 mg | INTRAVENOUS | Status: DC | PRN
  Administered 2022-10-24: 20 mg via INTRAVENOUS
  Filled 2022-10-24: qty 4

## 2022-10-24 MED ORDER — MISOPROSTOL 25 MCG QUARTER TABLET
25.0000 ug | ORAL_TABLET | Freq: Once | ORAL | Status: AC
  Administered 2022-10-24: 25 ug via VAGINAL
  Filled 2022-10-24: qty 1

## 2022-10-24 MED ORDER — MISOPROSTOL 25 MCG QUARTER TABLET
25.0000 ug | ORAL_TABLET | Freq: Once | ORAL | Status: AC
  Administered 2022-10-24: 25 ug via ORAL
  Filled 2022-10-24: qty 1

## 2022-10-24 MED ORDER — IBUPROFEN 600 MG PO TABS
600.0000 mg | ORAL_TABLET | Freq: Four times a day (QID) | ORAL | Status: DC
  Administered 2022-10-24 – 2022-10-26 (×5): 600 mg via ORAL
  Filled 2022-10-24 (×6): qty 1

## 2022-10-24 MED ORDER — SIMETHICONE 80 MG PO CHEW: 80.0000 mg | CHEWABLE_TABLET | ORAL | Status: DC | PRN

## 2022-10-24 MED ORDER — LIDOCAINE HCL (PF) 1 % IJ SOLN: 30.0000 mL | INTRAMUSCULAR | Status: DC | PRN

## 2022-10-24 MED ORDER — SENNOSIDES-DOCUSATE SODIUM 8.6-50 MG PO TABS
2.0000 | ORAL_TABLET | Freq: Every day | ORAL | Status: DC
  Administered 2022-10-25 – 2022-10-26 (×2): 2 via ORAL
  Filled 2022-10-24 (×2): qty 2

## 2022-10-24 MED ORDER — OXYTOCIN BOLUS FROM INFUSION: 333.0000 mL | Freq: Once | INTRAVENOUS | Status: DC

## 2022-10-24 MED ORDER — SOD CITRATE-CITRIC ACID 500-334 MG/5ML PO SOLN: 30.0000 mL | ORAL | Status: DC | PRN

## 2022-10-24 MED ORDER — ACETAMINOPHEN 325 MG PO TABS: 650.0000 mg | ORAL_TABLET | ORAL | Status: DC | PRN

## 2022-10-24 MED ORDER — WITCH HAZEL-GLYCERIN EX PADS
1.0000 | MEDICATED_PAD | CUTANEOUS | Status: DC | PRN
  Administered 2022-10-24: 1 via TOPICAL
  Filled 2022-10-24: qty 100

## 2022-10-24 MED ORDER — TERBUTALINE SULFATE 1 MG/ML IJ SOLN: 0.2500 mg | Freq: Once | INTRAMUSCULAR | Status: DC | PRN

## 2022-10-24 NOTE — Progress Notes (Signed)
Labor Progress Note  Sandy Figueroa is a 42 y.o. G3P2002 at 72w4dby LMP admitted for induction of labor due to Hypertension.  Subjective: feeling painful UCs  Objective: BP (!) 141/88   Pulse 92   Temp 97.7 F (36.5 C) (Oral)   Resp 18   Ht '5\' 3"'$  (1.6 m)   Wt 93 kg   LMP 01/27/2022   BMI 36.32 kg/m  Notable VS details: reviewed Vitals:   10/24/22 1040 10/24/22 1431 10/24/22 1445 10/24/22 1505  BP: (!) 137/97 (!) 170/114 (!) 168/98 (!) 155/101   10/24/22 1515 10/24/22 1540 10/24/22 1550 10/24/22 1605  BP: (!) 151/100 (!) 156/106 (!) 155/102 (!) 163/103   10/24/22 1618 10/24/22 1635 10/24/22 1700 10/24/22 1712  BP: (!) 152/98 (!) 145/104 (!) 169/116 (!) 141/88   - Given IV Labetalol '80mg'$  at 1706  Fetal Assessment: FHT:  FHR: 130 bpm, variability: moderate,  accelerations:  Present,  decelerations:  Absent Category/reactivity:  Category I UC:   irregular, every 2-3 minutes SVE:   4/60/-1; soft/anterior but slightly displaced to pt right  Membrane status: AROM at 1421 Amniotic color: clear  Assessment / Plan: G3P2002 at 374w4dIOL due to uncontrolled CHTN Early labor  Labor: s/p loading dose of oral and vaginal cytotec, AROM now. Pt prefers to avoid Pitocin still. Cx changing.  Preeclampsia:  Labs WNL; intermittent severe BP, no lab abnormalities or Pre-E sx; managing with IV Labetalol  Fetal Wellbeing:  Category I Pain Control:  Labor support without medications I/D:  n/a Anticipated MOD:  NSVD  ReFrancetta FoundCNM 10/24/2022, 5:26 PM

## 2022-10-24 NOTE — Plan of Care (Signed)
Transferred to Room 338. Alert and oriented with appropriate affect. Color good, skin w&d. Assessment and VS WNL. Oriented to Room, use of Nurse call Gloriann Loan, Safety and Security, Fall Prevention and POC. Pt. V/o. Pt. Stated she only wanted her Mother and Infant's Father to vist. I instructed Pt. In Directory and that she could be a "No" in the directory and designate a Password for so that her visitation desires could be certain. Pt. V/o and declines being a "No" in the Directory.

## 2022-10-24 NOTE — Progress Notes (Signed)
Labor Progress Note  Sandy Figueroa is a 42 y.o. G3P2002 at 6w4dby LMP admitted for induction of labor due to Hypertension.  Subjective: feeling painful UCs, starts in abdomen and radiates around to her back. Less intense when sitting on birthing ball, able to talk some during UCs.   Objective: BP (!) 137/97   Pulse 96   Temp 98.4 F (36.9 C) (Oral)   Resp 18   Ht '5\' 3"'$  (1.6 m)   Wt 93 kg   LMP 01/27/2022   BMI 36.32 kg/m  Notable VS details: reviewed Vitals:   10/24/22 0854 10/24/22 1040  BP: (!) 141/91 (!) 137/97     Fetal Assessment: FHT:  FHR: 135 bpm, variability: moderate,  accelerations:  Present,  decelerations:  Absent Category/reactivity:  Category I UC:   irregular, every 2-4 minutes SVE:   3/50/-1; soft/anterior; AROM performed, small amount clear fluid.   Membrane status: AROM at 1421 Amniotic color: clear  Labs: Lab Results  Component Value Date   WBC 7.6 10/24/2022   HGB 11.0 (L) 10/24/2022   HCT 32.6 (L) 10/24/2022   MCV 85.6 10/24/2022   PLT 214 10/24/2022    Assessment / Plan: [id number] 41w4dIOL due to uncontrolled CHTN Early labor  Labor: s/p loading dose of oral and vaginal cytotec, AROM now. Pt prefers to avoid Pitocin Preeclampsia:  Labs WNL Fetal Wellbeing:  Category I Pain Control:  Labor support without medications I/D:  n/a Anticipated MOD:  NSVD  Sandy FoundCNM 10/24/2022, 2:29 PM

## 2022-10-24 NOTE — Progress Notes (Signed)
Labor Progress Note  Sandy Figueroa is a 42 y.o. G3P2002 at 89w4dby LMP admitted for induction of labor due to Hypertension.  Subjective: feeling painful UCs and more pressure, requesting IVPM  Objective: BP (!) 135/106   Pulse 85   Temp 97.7 F (36.5 C) (Oral)   Resp 18   Ht '5\' 3"'$  (1.6 m)   Wt 93 kg   LMP 01/27/2022   BMI 36.32 kg/m  Notable VS details: reviewed Vitals:   10/24/22 1540 10/24/22 1550 10/24/22 1605 10/24/22 1618  BP: (!) 156/106 (!) 155/102 (!) 163/103 (!) 152/98   10/24/22 1635 10/24/22 1700 10/24/22 1712 10/24/22 1742  BP: (!) 145/104 (!) 169/116 (!) 141/88 (!) 165/122   10/24/22 1746 10/24/22 1817 10/24/22 1847 10/24/22 1905  BP: (!) 135/94 (!) 158/108 (!) 172/101 (!) 135/106   - Given IV Labetalol '80mg'$  at 1706  Fetal Assessment: FHT:  FHR: 130 bpm, variability: moderate,  accelerations:  Present,  decelerations:  Present early Category/reactivity:  Category I UC:   irregular, every 2-4 minutes; some coupling.  SVE:   8/75/-1; soft/anterior  Membrane status: AROM at 1421 Amniotic color: clear  Assessment / Plan: G3P2002 at 394w4dIOL due to uncontrolled CHTN Active labor  Labor: s/p loading dose of oral and vaginal cytotec, AROM now. Pt prefers to avoid Pitocin still. Cx changing.  Preeclampsia:  Labs WNL; intermittent severe BP, no lab abnormalities or Pre-E sx; managing with IV Labetalol  Fetal Wellbeing:  Category I Pain Control:  Labor support without medications; IVPM x 1, considering nitrous.  I/D:  n/a Anticipated MOD:  NSVD  ReFrancetta FoundCNM 10/24/2022, 7:10 PM

## 2022-10-24 NOTE — H&P (Signed)
OB History & Physical   History of Present Illness:  Chief Complaint: induction  HPI:  Sandy Figueroa is a 42 y.o. G42P2002 female at 65w4ddated by LMP, EDD 11/03/22.  She presents to L&D for scheduled IOL due to poor control of CHTN.   Denies current UCs, VB or LOF; reports active FM.     Pregnancy Issues: Chronic HTN in pregnancy: Labetalol '200mg'$  daily, Procardia  '60mg'$  daily Lapse in prenatal care  AMA Obesity in pregnancy  Varicella non-immune      Maternal Medical History:   Past Medical History:  Diagnosis Date   Depression    History of kidney stones     Past Surgical History:  Procedure Laterality Date   HEMI-MICRODISCECTOMY LUMBAR LAMINECTOMY LEVEL 1 Right 11/20/2019   Procedure: REDO RIGHT L4-5 HEMILAMINECTOMY AND DISCECTOMY;  Surgeon: CDeetta Perla MD;  Location: [facility] ORS;  Service: Neurosurgery;  Laterality: Right;   SPINE SURGERY     WISDOM TOOTH EXTRACTION      Allergies  Allergen Reactions   Methylprednisolone Other (See Comments)    Vision loss Was able to tolerate prednisone    Prior to Admission medications   Medication Sig Start Date End Date Taking? Authorizing Provider  acetaminophen (TYLENOL) 500 MG tablet Take 500 mg by mouth every 6 (six) hours as needed.    [provider]  amoxicillin (AMOXIL) 500 MG capsule Take 1 capsule (500 mg total) by mouth 2 (two) times daily for 10 days. 10/22/22 11/01/22  MMinda Meo CNM  aspirin EC 81 MG tablet Take 81 mg by mouth daily. Swallow whole.    [provider]  calcium carbonate (TUMS - DOSED IN MG ELEMENTAL CALCIUM) 500 MG chewable tablet Chew 1 tablet by mouth daily.    [provider]  docusate sodium (COLACE) 100 MG capsule Take 1 capsule (100 mg total) by mouth daily. [id number]  DEd Blalock CNM  famotidine (PEPCID) 20 MG tablet Take 20 mg by mouth daily.    [provider]  labetalol (NORMODYNE) 200 MG tablet Take 1 tablet (200 mg total) by  mouth 2 (two) times daily. 10/23/22   MMinda Meo CNM  NIFEdipine (PROCARDIA-XL/NIFEDICAL-XL) 30 MG 24 hr tablet Take 2 tablets (60 mg total) by mouth daily. Changed to 60 mg 10/06/22 10/22/22 10/22/23  MMinda Meo CNM  Prenatal MV & Min w/FA-DHA (PRENATAL GUMMIES PO) Take 1 tablet by mouth daily.    [provider]     [place]  reports that she quit smoking about 16 years ago. Her smoking use included cigarettes. She has never used smokeless tobacco. She reports that she does not currently use alcohol. She reports that she does not use drugs.  Family History: family history includes Diabetes in her maternal aunt and maternal uncle; Heart disease in her father; Hyperlipidemia in her father; Hypertension in her father; Kidney disease in her father; Stroke in her father.   Review of Systems: A full review of systems was performed and negative except as noted in the HPI.     Physical Exam:  Vital Signs: BP (!) 141/91   Ht '5\' 3"'$  (1.6 m)   Wt 93 kg   LMP 01/27/2022   BMI 36.32 kg/m  Vitals:   10/24/22 0854  BP: (!) 141/91    General: no acute distress.  HEENT: normocephalic, atraumatic Heart: regular rate & rhythm.  No murmurs/rubs/gallops Lungs: clear to  auscultation bilaterally, normal respiratory effort Abdomen: soft, gravid, non-tender;  EFW: 7.5lbs by Curlene Labrum Pelvic:   External: Normal external female genitalia  Cervix: Dilation: 2.5 / Effacement (%): 40, 50 / Station: -2    Extremities: non-tender, symmetric, trace edema bilaterally.  DTRs: 2+  Neurologic: Alert & oriented x 3.    Results for orders placed or performed during the hospital encounter of 10/24/22 (from the past 24 hour(s))  CBC     Status: Abnormal   Collection Time: 10/24/22  9:05 AM  Result Value Ref Range   WBC 7.6 4.0 - 10.5 K/uL   RBC 3.81 (L) 3.87 - 5.11 MIL/uL   Hemoglobin 11.0 (L) 12.0 - 15.0 g/dL   HCT 32.6 (L) 36.0 - 46.0 %    MCV 85.6 80.0 - 100.0 fL   MCH 28.9 26.0 - 34.0 pg   MCHC 33.7 30.0 - 36.0 g/dL   RDW 13.5 11.5 - 15.5 %   Platelets 214 150 - 400 K/uL   nRBC 0.0 0.0 - 0.2 %  Type and screen [place]     Status: None   Collection Time: 10/24/22  9:05 AM  Result Value Ref Range   ABO/RH(D) O POS    Antibody Screen NEG    Sample Expiration      10/27/2022,2359 Performed at [facility], [address] [place], [place] [address]   Protein / creatinine ratio, urine     Status: None   Collection Time: 10/24/22  9:05 AM  Result Value Ref Range   Creatinine, Urine 104 mg/dL   Total Protein, Urine 11 mg/dL   Protein Creatinine Ratio 0.11 0.00 - 0.15 mg/mg[Cre]  Comprehensive metabolic panel     Status: Abnormal   Collection Time: 10/24/22  9:05 AM  Result Value Ref Range   Sodium 134 (L) 135 - 145 mmol/L   Potassium 3.5 3.5 - 5.1 mmol/L   Chloride 104 98 - 111 mmol/L   CO2 19 (L) 22 - 32 mmol/L   Glucose, Bld 134 (H) 70 - 99 mg/dL   BUN 18 6 - 20 mg/dL   Creatinine, Ser 0.80 0.44 - 1.00 mg/dL   Calcium 9.9 8.9 - 10.3 mg/dL   Total Protein 6.9 6.5 - 8.1 g/dL   Albumin 2.8 (L) 3.5 - 5.0 g/dL   AST 34 15 - 41 U/L   ALT 16 0 - 44 U/L   Alkaline Phosphatase 138 (H) 38 - 126 U/L   Total Bilirubin 0.3 0.3 - 1.2 mg/dL   GFR, Estimated >60 >60 mL/min   Anion gap 11 5 - 15    Pertinent Results:  Prenatal Labs: Blood type/Rh O POS  Antibody screen neg  Rubella Immune  Varicella  NON-Immune  RPR NR  HBsAg Neg  HIV NR  GC neg  Chlamydia neg  Genetic screening Negative MaterniT21  1 hour GTT  Not done  3 hour GTT   GBS  Neg   FHT: 135bpm, mod var, + accels, no decels TOCO: irreg UCs, UI SVE:  Dilation: 2.5 / Effacement (%): 40, 50 / Station: -2    Cephalic by leopolds/SVE   Assessment:  Sandy Figueroa is a 42 y.o. G47P2002 female at 25w4dwith CHTN.   Plan:  1. Admit to Labor & Delivery; consents reviewed and obtained - Dr SOuida Sillsnotified of  admission and POC.   2. Fetal Well being  - Fetal Tracing: Cat I - Group B Streptococcus ppx indicated: Neg - Presentation:  cephalic confirmed by exam    3. Routine OB: - Prenatal labs reviewed, as above - Rh O Pos - CBC, T&S, RPR on admit - Clear fluids, saline lock  4. Induction of Labor -  Contractions: external toco in place -  Pelvis proven to 8#12 -  Plan for induction with cytotec, AROM; pt prefers to avoid Pitocin if possible. -  Plan for continuous fetal monitoring  -  Maternal pain control as desired - Anticipate vaginal delivery  5. Post Partum Planning: - Infant feeding: breast - Contraception: NFP - Tdap 10/09/22  Francetta Found, [place] 10/24/22 10:41 AM

## 2022-10-24 NOTE — Progress Notes (Signed)
R. MCVey CNM Notified per Pt. Request of B/P of 116/82. Order to Hold scheduled Labetalol received.

## 2022-10-25 LAB — CBC
HCT: 31.3 % — ABNORMAL LOW (ref 36.0–46.0)
Hemoglobin: 10.5 g/dL — ABNORMAL LOW (ref 12.0–15.0)
MCH: 28.8 pg (ref 26.0–34.0)
MCHC: 33.5 g/dL (ref 30.0–36.0)
MCV: 86 fL (ref 80.0–100.0)
Platelets: 205 10*3/uL (ref 150–400)
RBC: 3.64 MIL/uL — ABNORMAL LOW (ref 3.87–5.11)
RDW: 13.3 % (ref 11.5–15.5)
WBC: 11 10*3/uL — ABNORMAL HIGH (ref 4.0–10.5)
nRBC: 0.2 % (ref 0.0–0.2)

## 2022-10-25 LAB — COMPREHENSIVE METABOLIC PANEL
ALT: 15 U/L (ref 0–44)
AST: 26 U/L (ref 15–41)
Albumin: 2.6 g/dL — ABNORMAL LOW (ref 3.5–5.0)
Alkaline Phosphatase: 118 U/L (ref 38–126)
Anion gap: 12 (ref 5–15)
BUN: 17 mg/dL (ref 6–20)
CO2: 19 mmol/L — ABNORMAL LOW (ref 22–32)
Calcium: 9.5 mg/dL (ref 8.9–10.3)
Chloride: 104 mmol/L (ref 98–111)
Creatinine, Ser: 0.8 mg/dL (ref 0.44–1.00)
GFR, Estimated: >60 mL/min (ref >60)
Glucose, Bld: 99 mg/dL (ref 70–99)
Potassium: 3.6 mmol/L (ref 3.5–5.1)
Sodium: 135 mmol/L (ref 135–145)
Total Bilirubin: 0.5 mg/dL (ref 0.3–1.2)
Total Protein: 6.1 g/dL — ABNORMAL LOW (ref 6.5–8.1)

## 2022-10-25 LAB — RPR: RPR Ser Ql: NONREACTIVE

## 2022-10-25 MED ORDER — VARICELLA VIRUS VACCINE LIVE 1350 PFU/0.5ML IJ SUSR
0.5000 mL | INTRAMUSCULAR | Status: DC | PRN
  Filled 2022-10-25: qty 0.5

## 2022-10-25 NOTE — Discharge Summary (Signed)
Obstetrical Discharge Summary  Patient Name: Sandy Figueroa DOB: 1980-12-14 MRN: [id number]  Date of Admission: 10/24/2022 Date of Delivery: 10/24/22 Delivered by: Burton Apley  Date of Discharge: 10/26/2022  Primary OB: [facility] [id number] last menstrual period was 01/27/2022. EDC Estimated Date of Delivery: 11/03/22 Gestational Age at Delivery: [redacted]w[redacted]d  Antepartum complications:  Chronic HTN in pregnancy: Labetalol '200mg'$  daily, Procardia  '60mg'$  daily Lapse in prenatal care  AMA Obesity in pregnancy  Varicella non-immune   Admitting Diagnosis: Chronic hypertension during pregnancy, antepartum [O10.919]  Secondary Diagnosis: SVD Patient Active Problem List   Diagnosis Date Noted   NSVD (normal spontaneous vaginal delivery) 10/26/2022   Chronic hypertension during pregnancy, antepartum 10/24/2022   Chronic hypertension during pregnancy 10/22/2022   Elevated blood pressure affecting pregnancy in third trimester, antepartum 09/24/2022   Elderly multigravida, second trimester 05/01/2022   Postpartum care following vaginal delivery 03/23/2017   Depression affecting pregnancy in third trimester, antepartum 01/29/2017   Supervision of high risk pregnancy, antepartum, third trimester 11/17/2016    Discharge Diagnosis: Term Pregnancy Delivered and CHTN      Augmentation: AROM and Cytotec Complications: None Intrapart[facility] complications/course: [facility], with progression to C/C/+1 with effective pushing over intact perineum.  Delivery Type: spontaneous vaginal delivery Anesthesia: non-pharmacological methods, IV narcotics Placenta: spontaneous To Pathology: No  Laceration: none Episiotomy: none Newborn Data: Live born female "Lucy" Birth Weight:  7#11 APGAR: 8, 9  Newborn Delivery   Birth date/time: 10/24/2022 19:18:00 Delivery type: Vaginal, Spontaneous      Postpartum Procedures:  none  Edinburgh:     10/25/2022     4:18 PM  Edinburgh Postnatal Depression Scale Screening Tool  I have been able to laugh and see the funny side of things. 0  I have looked forward with enjoyment to things. 1  I have blamed myself unnecessarily when things went wrong. 1  I have been anxious or worried for no good reason. 1  I have felt scared or panicky for no good reason. 0  Things have been getting on top of me. 2  I have been so unhappy that I have had difficulty sleeping. 0  I have felt sad or miserable. 0  I have been so unhappy that I have been crying. 0  The thought of harming myself has occurred to me. 0  Edinburgh Postnatal Depression Scale Total 5     Post partum course:  Patient had an uncomplicated postpartum course.  By time of discharge on PPD#2, her pain was controlled on oral pain medications; she had appropriate lochia and was ambulating, voiding without difficulty and tolerating regular diet.  She was deemed stable for discharge to home.      Discharge Physical Exam:  BP (!) 125/95 (BP Location: Left Arm) Comment: nurse SManuela Schwartznotified  Pulse 96   Temp 98.3 F (36.8 C) (Oral)   Resp 18   Ht '5\' 3"'$  (1.6 m)   Wt 93 kg   LMP 01/27/2022   SpO2 98% Comment: Room Air  Breastfeeding Unknown   BMI 36.32 kg/m   General: NAD CV: RRR Pulm: CTABL, nl effort ABD: s/nd/nt, fundus firm and below the umbilicus Lochia: moderate Perineum:minimal edema/intact DVT Evaluation: LE non-ttp, no evidence of DVT on exam.  Hemoglobin  Date Value Ref Range Status  10/25/2022 10.5 (L) 12.0 - 15.0 g/dL Final  01/29/2017 12.7 11.1 - 15.9 g/dL Final  08/31/2016 14.0 g/dL Final  HCT  Date Value Ref Range Status  10/25/2022 31.3 (L) 36.0 - 46.0 % Final  08/31/2016 42 % Final   Hematocrit  Date Value Ref Range Status  01/29/2017 37.6 34.0 - 46.6 % Final    Risk assessment for postpartum VTE and prophylactic treatment: Very high risk factors: None High risk factors: None Moderate risk factors: BMI 30-40  kg/m2  Postpartum VTE prophylaxis with LMWH not indicated  Disposition: stable, discharge to home. Baby Feeding: breast feeding Baby Disposition: home with mom  Rh Immune globulin indicated: No Rubella vaccine given: was not indicated Varivax vaccine given: indicated, offered vaccine at discharge Flu vaccine given in AP setting: No Tdap vaccine given in AP setting: Yes   Contraception: condoms, natural family planning (NFP)  Prenatal Labs:   Blood type/Rh O POS  Antibody screen neg  Rubella Immune  Varicella  NON-Immune  RPR NR  HBsAg Neg  HIV NR  GC neg  Chlamydia neg  Genetic screening Negative MaterniT21  1 hour GTT  Not done  3 hour GTT    GBS  Neg     Plan:  Sandy Figueroa was discharged to home in good condition. Follow-up appointment with delivering provider in 1 weeks.  Discharge Medications: Allergies as of 10/26/2022       Reactions   Methylprednisolone Other (See Comments)   Vision loss Was able to tolerate prednisone        Medication List     STOP taking these medications    aspirin EC 81 MG tablet   labetalol 200 MG tablet Commonly known as: NORMODYNE       TAKE these medications    acetaminophen 500 MG tablet Commonly known as: TYLENOL Take 500 mg by mouth every 6 (six) hours as needed.   amoxicillin 500 MG capsule Commonly known as: AMOXIL Take 1 capsule (500 mg total) by mouth 2 (two) times daily for 10 days.   calcium carbonate 500 MG chewable tablet Commonly known as: TUMS - dosed in mg elemental calcium Chew 1 tablet by mouth daily.   docusate sodium 100 MG capsule Commonly known as: COLACE Take 1 capsule (100 mg total) by mouth daily.   famotidine 20 MG tablet Commonly known as: PEPCID Take 20 mg by mouth daily.   ibuprofen 600 MG tablet Commonly known as: ADVIL Take 1 tablet (600 mg total) by mouth every 6 (six) hours as needed.   NIFEdipine 90 MG 24 hr tablet Commonly known as: PROCARDIA  XL/NIFEDICAL-XL Take 1 tablet (90 mg total) by mouth daily. What changed:  medication strength how much to take additional instructions   PRENATAL GUMMIES PO Take 1 tablet by mouth daily.         Follow-up [facility] Follow up.   Why: make appt for 1 week BP check and for 6wk PP visit. Contact information: [address] [place] [phone]                Signed: Clydene Laming, CNM 10/26/2022 9:24 AM

## 2022-10-25 NOTE — Progress Notes (Addendum)
Post Partum Day 1 Subjective: Doing well, no complaints.  Tolerating regular diet, pain with PO meds, voiding and ambulating without difficulty.  No CP SOB Fever,Chills, N/V or leg pain; denies nipple or breast pain no HA change of vision, RUQ/epigastric pain  Objective: BP 121/87 (BP Location: Right Arm)   Pulse 90   Temp 97.7 F (36.5 C) (Oral)   Resp 18   Ht '5\' 3"'$  (1.6 m)   Wt 93 kg   LMP 01/27/2022   SpO2 98%   Breastfeeding Unknown   BMI 36.32 kg/m   Vitals:   10/24/22 1746 10/24/22 1817 10/24/22 1847 10/24/22 1905  BP: (!) 135/94 (!) 158/108 (!) 172/101 (!) 135/106   10/24/22 1927 10/24/22 1943 10/24/22 1958 10/24/22 2013  BP: (!) 157/100 (!) 144/101 (!) 144/92 137/89   10/24/22 2200 10/24/22 2325 10/25/22 0315 10/25/22 0843  BP: 114/83 116/82 125/88 121/87      Physical Exam:  General: NAD Breasts: soft/nontender CV: RRR Pulm: nl effort, CTABL Abdomen: soft, NT, BS x 4 Perineum: minimal edema, intact Lochia: small Uterine Fundus: fundus firm and 1 fb below umbilicus DVT Evaluation: no cords, ttp LEs   Recent Labs    10/24/22 0905 10/25/22 0522  HGB 11.0* 10.5*  HCT 32.6* 31.3*  WBC 7.6 11.0*  PLT 214 205    Assessment/Plan: 42 y.o. G3P3003 postpartum day # 1  - CHTN: BP remaining WNL since shortly after delivery, will DC Labetalol  - Continue routine PP care - Lactation consult prn - Planning NFP for contraception.  - CBC reviewed, WNL  - Immunization status:  all Imms up to date    Disposition: Does not desire Dc home today.     Francetta Found, CNM 10/25/2022  10:47 AM

## 2022-10-25 NOTE — Discharge Instructions (Signed)

## 2022-10-26 MED ORDER — IBUPROFEN 600 MG PO TABS: 600.0000 mg | ORAL_TABLET | Freq: Four times a day (QID) | ORAL | Status: AC | PRN

## 2022-10-26 MED ORDER — NIFEDIPINE ER OSMOTIC RELEASE 90 MG PO TB24: 90.0000 mg | ORAL_TABLET | Freq: Every day | ORAL | 6 refills | Status: AC

## 2022-10-26 NOTE — Progress Notes (Signed)
.  Patient discharged home with family.  Discharge instructions, when to follow up, and prescriptions reviewed with patient.  Patient verbalized understanding. Patient will be escorted out by auxiliary.   

## 2022-11-30 ENCOUNTER — Telehealth: Payer: Self-pay

## 2022-11-30 NOTE — Telephone Encounter (Signed)
[facility]- Discharge Call Backs-Left pt a VM about the following below. 1-Do you have any questions or concerns about yourself as you heal? 2-Any concerns or questions about your baby? 3-How was your stay at the hospital? 4-How did our team work together to care for you? You should be receiving a survey in the mail soon.   We would really appreciate it if you could fill that out for us and return it in the mail.  We value the feedback to make improvements and continue the great work we do.   If you have any questions please feel free to call me back at [phone]  

## 2024-07-11 ENCOUNTER — Other Ambulatory Visit: Payer: Self-pay | Admitting: Certified Nurse Midwife

## 2024-07-11 ENCOUNTER — Ambulatory Visit
Admission: RE | Admit: 2024-07-11 | Discharge: 2024-07-11 | Disposition: A | Discharge status: Home / Self Care | Source: Ambulatory Visit | Attending: Certified Nurse Midwife | Admitting: Certified Nurse Midwife

## 2024-07-11 DIAGNOSIS — N939 Abnormal uterine and vaginal bleeding, unspecified: Secondary | ICD-10-CM
